# Patient Record
Sex: Male | Born: 1970 | Race: Black or African American | Hispanic: No | Marital: Married | Smoking: Current every day smoker
Health system: Southern US, Community
[De-identification: ages and names within clinical notes are randomized; demographics above are authoritative.]

## PROBLEM LIST (undated history)

## (undated) DIAGNOSIS — F1010 Alcohol abuse, uncomplicated: Secondary | ICD-10-CM

---

## 2012-02-19 ENCOUNTER — Encounter (HOSPITAL_COMMUNITY): Payer: Self-pay | Admitting: *Deleted

## 2012-02-19 ENCOUNTER — Emergency Department (HOSPITAL_COMMUNITY)
Admission: EM | Admit: 2012-02-19 | Discharge: 2012-02-19 | Disposition: A | Discharge status: Home / Self Care | Payer: Self-pay | Attending: Emergency Medicine | Admitting: Emergency Medicine

## 2012-02-19 DIAGNOSIS — K029 Dental caries, unspecified: Secondary | ICD-10-CM | POA: Insufficient documentation

## 2012-02-19 DIAGNOSIS — K0889 Other specified disorders of teeth and supporting structures: Secondary | ICD-10-CM

## 2012-02-19 DIAGNOSIS — F17200 Nicotine dependence, unspecified, uncomplicated: Secondary | ICD-10-CM | POA: Insufficient documentation

## 2012-02-19 MED ORDER — HYDROCODONE-ACETAMINOPHEN 5-325 MG PO TABS: 1.0000 | ORAL_TABLET | Freq: Four times a day (QID) | ORAL | Status: DC | PRN

## 2012-02-19 MED ORDER — PENICILLIN V POTASSIUM 500 MG PO TABS: 500.0000 mg | ORAL_TABLET | Freq: Four times a day (QID) | ORAL | Status: DC

## 2012-02-19 NOTE — ED Provider Notes (Signed)
Medical screening examination/treatment/procedure(s) were performed by non-physician practitioner and as supervising physician I was immediately available for consultation/collaboration.   Argus Caraher Y. Sherel Fennell, MD 02/19/12 1007 

## 2012-02-19 NOTE — ED Provider Notes (Signed)
History     CSN: [id number]  Arrival date & time 02/19/12  0804   First MD Initiated Contact with Patient 02/19/12 0825      Chief Complaint  Patient presents with  . Dental Pain    (Consider location/radiation/quality/duration/timing/severity/associated sxs/prior treatment) HPI The patient presents to the emergency department with dental pain.  The pain began Wednesday night with no relief from otc Advil and Orajel.  He states the pain is sharp and radiates up his right jaw.  The pain lasts for approximately five minutes and then fades away.  He states the pain is giving him a headache and rates the pain 10/10 at this time.   The pain is aggravated by chewing and talking.  He reports a history of dental abscesses and poor dental hygiene.   He denies fever, facial swelling, dysphagia, sore throat, cough, nausea, and vomiting.    History reviewed. No pertinent past medical history.  History reviewed. No pertinent past surgical history.  No family history on file.  History  Substance Use Topics  . Smoking status: Current Every Day Smoker -- 0.5 packs/day    Types: Cigarettes  . Smokeless tobacco: Not on file  . Alcohol Use: Yes      Review of Systems All pertinent positives and negatives in the history of present illness   Allergies  Review of patient's allergies indicates no known allergies.  Home Medications  No current outpatient prescriptions on file.  BP 145/100  Pulse 99  Temp 98.9 F (37.2 C) (Oral)  Resp 18  SpO2 97%  Physical Exam  Nursing note and vitals reviewed. Constitutional: He appears well-developed and well-nourished.  HENT:  Head: Normocephalic and atraumatic. No trismus in the jaw.  Mouth/Throat: Uvula is midline, oropharynx is clear and moist and mucous membranes are normal. Abnormal dentition. Dental caries present. No dental abscesses or uvula swelling. No oropharyngeal exudate.  Neck: Normal range of motion. Neck supple.  Cardiovascular:  Normal rate, regular rhythm and normal heart sounds.  Exam reveals no friction rub.   No murmur heard. Pulmonary/Chest: Effort normal and breath sounds normal. No respiratory distress.  Skin: Skin is warm and dry. No rash noted. No erythema. No pallor.    ED Course  Procedures (including critical care time) The patient will be referred to dentistry. He is told to return here as needed.    MDM          Carlyle Dolly, PA-C 02/19/12 [phone]

## 2012-02-19 NOTE — ED Notes (Signed)
Pt has had toothache in right lower molar since Wednesday.  Pain has been increasing since then

## 2012-02-23 ENCOUNTER — Emergency Department (HOSPITAL_COMMUNITY)
Admission: EM | Admit: 2012-02-23 | Discharge: 2012-02-23 | Disposition: A | Discharge status: Home / Self Care | Payer: Self-pay | Attending: Emergency Medicine | Admitting: Emergency Medicine

## 2012-02-23 DIAGNOSIS — K029 Dental caries, unspecified: Secondary | ICD-10-CM | POA: Insufficient documentation

## 2012-02-23 DIAGNOSIS — K0889 Other specified disorders of teeth and supporting structures: Secondary | ICD-10-CM

## 2012-02-23 DIAGNOSIS — F17200 Nicotine dependence, unspecified, uncomplicated: Secondary | ICD-10-CM | POA: Insufficient documentation

## 2012-02-23 MED ORDER — HYDROCODONE-ACETAMINOPHEN 5-500 MG PO TABS: 1.0000 | ORAL_TABLET | Freq: Four times a day (QID) | ORAL | Status: DC | PRN

## 2012-02-23 NOTE — ED Provider Notes (Signed)
History     CSN: [id number]  Arrival date & time 02/23/12  4098   First MD Initiated Contact with Patient 02/23/12 [phone]      Chief Complaint  Patient presents with  . Dental Pain    (Consider location/radiation/quality/duration/timing/severity/associated sxs/prior treatment) Patient is a Scott Hunt presenting with tooth pain. The history is provided by the patient.  Dental PainPrimary symptoms do not include headaches, fever, shortness of breath or sore throat.  Additional symptoms do not include: trouble swallowing.  pt states right lower dental pain x 1 week. Constant. Dull, non radiating. Was in ed 4 days ago w same, states talked to dentist, has plan to return there in the next 1-2 days, but was told from their office that he needed to come back to ed to get new referral. Denies any acute or abrupt worsening in pain. No facial swelling. No trouble breathing or swallowing. No fever/chills.   No past medical history on file.  No past surgical history on file.  No family history on file.  History  Substance Use Topics  . Smoking status: Current Every Day Smoker -- 0.5 packs/day    Types: Cigarettes  . Smokeless tobacco: Not on file  . Alcohol Use: Yes      Review of Systems  Constitutional: Negative for fever and chills.  HENT: Negative for sore throat and trouble swallowing.   Respiratory: Negative for shortness of breath.   Gastrointestinal: Negative for vomiting.  Skin: Negative for rash.  Neurological: Negative for headaches.    Allergies  Review of patient's allergies indicates no known allergies.  Home Medications   Current Outpatient Rx  Name Route Sig Dispense Refill  . HYDROCODONE-ACETAMINOPHEN 5-325 MG PO TABS Oral Take 1 tablet by mouth every 6 (six) hours as needed.    Marland Kitchen PENICILLIN V POTASSIUM 500 MG PO TABS Oral Take 500 mg by mouth 4 (four) times daily. 3 day supply started Friday 02-19-12 for infection only has 1 day worth left.      BP  139/89  Pulse 83  Temp 98.3 F (36.8 C) (Oral)  Resp 18  SpO2 98%  Physical Exam  Nursing note and vitals reviewed. Constitutional: He is oriented to person, place, and time. He appears well-developed and well-nourished. No distress.  HENT:  Head: Atraumatic.  Nose: Nose normal.  Mouth/Throat: Oropharynx is clear and moist. No oropharyngeal exudate.       Dental decay. Right lower molar w associated gum swelling and tenderness. No fluctuance. No pain/swelling/tenderness to floor of mouth or neck. No facial swelling. Pharynx normal. No trismus.   Eyes: Pupils are equal, round, and reactive to light.  Neck: Neck supple. No tracheal deviation present.  Cardiovascular: Normal rate.   Pulmonary/Chest: Effort normal. No accessory muscle usage. No respiratory distress.  Abdominal: He exhibits no distension.  Musculoskeletal: Normal range of motion.  Lymphadenopathy:    He has no cervical adenopathy.  Neurological: He is alert and oriented to person, place, and time.  Skin: Skin is warm and dry.  Psychiatric: He has a normal mood and affect.    ED Course  Procedures (including critical care time)     MDM  Pt states plans to go back to see Dr Russella Dar in next 1-2 days.         Suzi Roots, MD 02/23/12 [phone]

## 2012-02-23 NOTE — ED Notes (Signed)
Pt was seen in ED fri for toothpain.  Pt has seen dentist and is scheduled to have 4 teeth pulled today at 4pm.  Pt states he needs a referral letter for dentist before teeth can be pulled.  Pt staes the pain meds he was given haved helped with pain but the teeth need to be pulled.  Pt alert oriented X4

## 2012-10-29 ENCOUNTER — Emergency Department (HOSPITAL_COMMUNITY)
Admission: EM | Admit: 2012-10-29 | Discharge: 2012-10-29 | Disposition: A | Discharge status: Home / Self Care | Payer: Self-pay | Attending: Emergency Medicine | Admitting: Emergency Medicine

## 2012-10-29 ENCOUNTER — Encounter (HOSPITAL_COMMUNITY): Payer: Self-pay | Admitting: *Deleted

## 2012-10-29 DIAGNOSIS — Z8719 Personal history of other diseases of the digestive system: Secondary | ICD-10-CM | POA: Insufficient documentation

## 2012-10-29 DIAGNOSIS — R1013 Epigastric pain: Secondary | ICD-10-CM | POA: Insufficient documentation

## 2012-10-29 DIAGNOSIS — Z8711 Personal history of peptic ulcer disease: Secondary | ICD-10-CM | POA: Insufficient documentation

## 2012-10-29 DIAGNOSIS — F1020 Alcohol dependence, uncomplicated: Secondary | ICD-10-CM

## 2012-10-29 DIAGNOSIS — F17200 Nicotine dependence, unspecified, uncomplicated: Secondary | ICD-10-CM | POA: Insufficient documentation

## 2012-10-29 DIAGNOSIS — F10229 Alcohol dependence with intoxication, unspecified: Secondary | ICD-10-CM | POA: Insufficient documentation

## 2012-10-29 DIAGNOSIS — K2920 Alcoholic gastritis without bleeding: Secondary | ICD-10-CM | POA: Insufficient documentation

## 2012-10-29 DIAGNOSIS — R112 Nausea with vomiting, unspecified: Secondary | ICD-10-CM | POA: Insufficient documentation

## 2012-10-29 HISTORY — DX: Alcohol abuse, uncomplicated: F10.10

## 2012-10-29 LAB — LIPASE, BLOOD: Lipase: 56 U/L (ref 11–59)

## 2012-10-29 LAB — CBC WITH DIFFERENTIAL/PLATELET
Basophils Absolute: 0.1 10*3/uL (ref 0.0–0.1)
Basophils Relative: 1 % (ref 0–1)
Eosinophils Absolute: 0.1 10*3/uL (ref 0.0–0.7)
Eosinophils Relative: 1 % (ref 0–5)
HCT: 44.2 % (ref 39.0–52.0)
MCH: 31.3 pg (ref 26.0–34.0)
MCHC: 34.8 g/dL (ref 30.0–36.0)
MCV: 89.8 fL (ref 78.0–100.0)
Monocytes Absolute: 0.4 10*3/uL (ref 0.1–1.0)
Neutro Abs: 4.6 10*3/uL (ref 1.7–7.7)
RDW: 12.8 % (ref 11.5–15.5)

## 2012-10-29 LAB — COMPREHENSIVE METABOLIC PANEL
AST: 26 U/L (ref 0–37)
Albumin: 4.7 g/dL (ref 3.5–5.2)
Calcium: 9.4 mg/dL (ref 8.4–10.5)
Creatinine, Ser: 1.12 mg/dL (ref 0.50–1.35)

## 2012-10-29 LAB — ETHANOL: Alcohol, Ethyl (B): 203 mg/dL — ABNORMAL HIGH (ref 0–11)

## 2012-10-29 MED ORDER — ONDANSETRON HCL 4 MG/2ML IJ SOLN
4.0000 mg | Freq: Once | INTRAMUSCULAR | Status: AC
  Administered 2012-10-29: 4 mg via INTRAVENOUS
  Filled 2012-10-29: qty 2

## 2012-10-29 MED ORDER — FAMOTIDINE IN NACL 20-0.9 MG/50ML-% IV SOLN
20.0000 mg | Freq: Once | INTRAVENOUS | Status: AC
  Administered 2012-10-29: 20 mg via INTRAVENOUS
  Filled 2012-10-29: qty 50

## 2012-10-29 MED ORDER — SODIUM CHLORIDE 0.9 % IV BOLUS (SEPSIS)
1000.0000 mL | Freq: Once | INTRAVENOUS | Status: AC
  Administered 2012-10-29: 1000 mL via INTRAVENOUS

## 2012-10-29 MED ORDER — THIAMINE HCL 100 MG/ML IJ SOLN
100.0000 mg | Freq: Once | INTRAMUSCULAR | Status: AC
  Administered 2012-10-29: 100 mg via INTRAVENOUS
  Filled 2012-10-29: qty 2

## 2012-10-29 MED ORDER — ONDANSETRON HCL 4 MG PO TABS: 4.0000 mg | ORAL_TABLET | Freq: Four times a day (QID) | ORAL | Status: DC

## 2012-10-29 MED ORDER — PANTOPRAZOLE SODIUM 20 MG PO TBEC: 40.0000 mg | DELAYED_RELEASE_TABLET | Freq: Every day | ORAL | Status: DC

## 2012-10-29 MED ORDER — SODIUM CHLORIDE 0.9 % IV BOLUS (SEPSIS): 1000.0000 mL | Freq: Once | INTRAVENOUS | Status: DC

## 2012-10-29 NOTE — ED Notes (Signed)
The pt arrived by gems  From home the pt has been vomiting and having chest pain tonight after drinking alcohol all day

## 2012-10-29 NOTE — ED Notes (Signed)
The pts pain did not start until he started drinking.  He has a history of pancreatitis according to family sitting with the pt.

## 2012-10-29 NOTE — ED Notes (Signed)
Pt sleeping. 

## 2012-10-29 NOTE — ED Notes (Signed)
1000 ml nss infused.  1000cc nss  Denny Levy

## 2012-10-29 NOTE — ED Notes (Signed)
Patient discharged with his wife instructions given using the teach back method. Patient verbalizes an understanding.

## 2012-10-29 NOTE — ED Provider Notes (Signed)
42yo M, received at change of shift. +heavy etoh intake last night. Sleeping most of ED visit. Now A&O, resps easy, tol PO well, ambulated with steady gait. Appears clinically sober and wants to go home now. Will d/c.   Laray Anger, DO 10/29/12 1059

## 2012-10-29 NOTE — ED Provider Notes (Signed)
History     CSN: [id number]  Arrival date & time 10/29/12  0400   First MD Initiated Contact with Patient 10/29/12 [phone]      Chief Complaint  Patient presents with  . Emesis    (Consider location/radiation/quality/duration/timing/severity/associated sxs/prior treatment) HPI  Scott Hunt is a 42 year old man who is brought to the emergency department from home. He is an alcoholic and drinks about one case of beer per day. His wife says that he drank his usual amount of beer today and then began taking shots of brandy, Hunt and vodka with family members at a family celebration.  Around 1 AM, the patient developed intractable nausea and vomiting along with epigastric pain. Emesis has been nonbloody and nonbilious. Patient has not had diarrhea. NO fever. No history of abdominal surgeries.   Wife says that the patient has a history of GERD, PUD and alcoholic pancreatitis.  He is not compliant with any medications. '  The only information that I am able to obtain from the patient is that he is experiencing abdominal pain.   Past Medical History  Diagnosis Date  . Alcohol abuse     No past surgical history on file.  No family history on file.  History  Substance Use Topics  . Smoking status: Current Every Day Smoker -- 0.50 packs/day    Types: Cigarettes  . Smokeless tobacco: Not on file  . Alcohol Use: Yes      Review of Systems Unable to obtain from the patient who is somnolent, responds to loud stimuli  Allergies  Review of patient's allergies indicates no known allergies.  Home Medications  No current outpatient prescriptions on file.  BP 113/68  Pulse 98  Temp(Src) 98.2 F (36.8 C) (Oral)  Resp 16  SpO2 96%  Physical Exam Gen: well developed and well nourished appearing Head: NCAT Eyes: PERL, EOMI Nose: no epistaixis or rhinorrhea Mouth/throat: mucosa is mildly dehydrated and pink Neck: supple, no stridor Lungs: CTA B, no wheezing, rhonchi or rales CV:  rapid and regular, fixed split S2, no murmur Abd: soft, tender epigastrium, nondistended Back: no ttp, no cva ttp Skin: no rashese, wnl Neuro: CN ii-xii grossly intact, no focal deficits Psyche; normal affect,  calm and cooperative.   ED Course  Procedures (including critical care time)  Results for orders placed during the hospital encounter of 10/29/12 (from the past 24 hour(s))  CBC WITH DIFFERENTIAL     Status: None   Collection Time    10/29/12  4:22 AM      Result Value Range   WBC 7.3  4.0 - 10.5 K/uL   RBC 4.92  4.22 - 5.81 MIL/uL   Hemoglobin 15.4  13.0 - 17.0 g/dL   HCT 87.5  64.3 - 32.9 %   MCV 89.8  78.0 - 100.0 fL   MCH 31.3  26.0 - 34.0 pg   MCHC 34.8  30.0 - 36.0 g/dL   RDW 51.8  84.1 - 66.0 %   Platelets 268  150 - 400 K/uL   Neutrophils Relative % 64  43 - 77 %   Neutro Abs 4.6  1.7 - 7.7 K/uL   Lymphocytes Relative 29  12 - 46 %   Lymphs Abs 2.1  0.7 - 4.0 K/uL   Monocytes Relative 6  3 - 12 %   Monocytes Absolute 0.4  0.1 - 1.0 K/uL   Eosinophils Relative 1  0 - 5 %   Eosinophils Absolute 0.1  0.0 -  0.7 K/uL   Basophils Relative 1  0 - 1 %   Basophils Absolute 0.1  0.0 - 0.1 K/uL  COMPREHENSIVE METABOLIC PANEL     Status: Abnormal   Collection Time    10/29/12  4:22 AM      Result Value Range   Sodium 137  135 - 145 mEq/L   Potassium 4.1  3.5 - 5.1 mEq/L   Chloride 101  96 - 112 mEq/L   CO2 23  19 - 32 mEq/L   Glucose, Bld 114 (*) 70 - 99 mg/dL   BUN 18  6 - 23 mg/dL   Creatinine, Ser 8.65  0.50 - 1.35 mg/dL   Calcium 9.4  8.4 - 78.4 mg/dL   Total Protein 8.7 (*) 6.0 - 8.3 g/dL   Albumin 4.7  3.5 - 5.2 g/dL   AST 26  0 - 37 U/L   ALT 28  0 - 53 U/L   Alkaline Phosphatase 133 (*) 39 - 117 U/L   Total Bilirubin 0.2 (*) 0.3 - 1.2 mg/dL   GFR calc non Af Amer 80 (*) >90 mL/min   GFR calc Af Amer >90  >90 mL/min  LIPASE, BLOOD     Status: None   Collection Time    10/29/12  4:22 AM      Result Value Range   Lipase 56  11 - 59 U/L  ETHANOL      Status: Abnormal   Collection Time    10/29/12  4:22 AM      Result Value Range   Alcohol, Ethyl (B) 203 (*) 0 - 11 mg/dL      MDM  ddx - alcoholic pancreatitis, pud, alcoholic gastritis.   Patient with alcohol intoxication and alcoholic gastritis. We are treating with IVF, thiamine, pepcid, antiemetic. Patient will need to be re-evaluated once clinically sober for the ability to tolerate po intake. If he is able to tolerate po intake, he should be stable for discharge with plan for PPI and close outpatient follow up.          Brandt Loosen, MD 10/29/12 [phone]

## 2012-10-29 NOTE — ED Notes (Signed)
Patient sitting up sipping on ginger ale.

## 2013-02-08 ENCOUNTER — Emergency Department (HOSPITAL_COMMUNITY)
Admission: EM | Admit: 2013-02-08 | Discharge: 2013-02-08 | Disposition: A | Discharge status: Home / Self Care | Payer: Self-pay | Attending: Emergency Medicine | Admitting: Emergency Medicine

## 2013-02-08 DIAGNOSIS — Y9389 Activity, other specified: Secondary | ICD-10-CM | POA: Insufficient documentation

## 2013-02-08 DIAGNOSIS — Y929 Unspecified place or not applicable: Secondary | ICD-10-CM | POA: Insufficient documentation

## 2013-02-08 DIAGNOSIS — IMO0002 Reserved for concepts with insufficient information to code with codable children: Secondary | ICD-10-CM | POA: Insufficient documentation

## 2013-02-08 DIAGNOSIS — F17200 Nicotine dependence, unspecified, uncomplicated: Secondary | ICD-10-CM | POA: Insufficient documentation

## 2013-02-08 DIAGNOSIS — X500XXA Overexertion from strenuous movement or load, initial encounter: Secondary | ICD-10-CM | POA: Insufficient documentation

## 2013-02-08 DIAGNOSIS — M62830 Muscle spasm of back: Secondary | ICD-10-CM

## 2013-02-08 DIAGNOSIS — Z79899 Other long term (current) drug therapy: Secondary | ICD-10-CM | POA: Insufficient documentation

## 2013-02-08 MED ORDER — IBUPROFEN 800 MG PO TABS: 800.0000 mg | ORAL_TABLET | Freq: Three times a day (TID) | ORAL | Status: DC

## 2013-02-08 MED ORDER — CYCLOBENZAPRINE HCL 10 MG PO TABS: 10.0000 mg | ORAL_TABLET | Freq: Two times a day (BID) | ORAL | Status: DC | PRN

## 2013-02-08 MED ORDER — CYCLOBENZAPRINE HCL 10 MG PO TABS
10.0000 mg | ORAL_TABLET | Freq: Once | ORAL | Status: AC
  Administered 2013-02-08: 10 mg via ORAL
  Filled 2013-02-08: qty 1

## 2013-02-08 NOTE — ED Notes (Signed)
Was lifting boxes yesterday now back hurts

## 2013-02-08 NOTE — ED Provider Notes (Signed)
CSN: [id number]     Arrival date & time 02/08/13  1339 History   This chart was scribed for non-physician practitioner Francee Piccolo, PA-C  working with Glynn Octave, MD by Valera Castle, ED scribe. This patient was seen in room TR11C/TR11C and the patient's care was started at 3:07 PM.    Chief Complaint  Patient presents with  . Back Pain    The history is provided by the patient. No language interpreter was used.   HPI Comments: Scott Hunt is a 42 y.o. male with a h/o EtOH abuse who presents to the Emergency Department complaining of sudden, sharp, throbbing, constant, left, lower, back pain, with a severity of 9/10 onset yesterday when he was lifting boxes. He reports that the pain radiates down this left side. He states that bending over and general movement exacerbates the pain.  He reports taking Motrin, and applying ice packs, with some relief initially, but that the pain returned. He denies fever, bladder or bowel incontinence, hx of cancer, hx of IVDA, and any other associated symptoms. He reports being a current every day smoker, .5 PPD, and occasional EtOH use. He has no known allergies, and he denies any other medical history. He denies h/o back surgeries.   Past Medical History  Diagnosis Date  . Alcohol abuse    No past surgical history on file. No family history on file. History  Substance Use Topics  . Smoking status: Current Every Day Smoker -- 0.50 packs/day    Types: Cigarettes  . Smokeless tobacco: Not on file  . Alcohol Use: Yes    Review of Systems  Constitutional: Negative for fever.  Musculoskeletal: Positive for back pain.  All other systems reviewed and are negative.    Allergies  Review of patient's allergies indicates no known allergies.  Home Medications   Current Outpatient Rx  Name  Route  Sig  Dispense  Refill  . ibuprofen (ADVIL,MOTRIN) 200 MG tablet   Oral   Take 400 mg by mouth every 6 (six) hours as needed for pain.          . cyclobenzaprine (FLEXERIL) 10 MG tablet   Oral   Take 1 tablet (10 mg total) by mouth 2 (two) times daily as needed for muscle spasms.   20 tablet   0   . ibuprofen (ADVIL,MOTRIN) 800 MG tablet   Oral   Take 1 tablet (800 mg total) by mouth 3 (three) times daily.   21 tablet   0    Triage Vitals: BP 127/89  Pulse 91  Temp(Src) 97.9 F (36.6 C)  Resp 16  SpO2 100%  Physical Exam  Nursing note and vitals reviewed. Constitutional: He is oriented to person, place, and time. He appears well-developed and well-nourished. No distress.  HENT:  Head: Normocephalic and atraumatic.  Eyes: Conjunctivae and EOM are normal. Pupils are equal, round, and reactive to light.  Neck: Normal range of motion. Neck supple. No spinous process tenderness present.  Cardiovascular: Normal rate, regular rhythm, normal heart sounds and intact distal pulses.   Pulmonary/Chest: Effort normal and breath sounds normal. No respiratory distress. He has no wheezes. He has no rales.  Abdominal: Soft. There is no tenderness.  Musculoskeletal: Normal range of motion.       Cervical back: Normal.       Thoracic back: Normal.       Lumbar back: He exhibits tenderness and spasm. He exhibits normal range of motion, no bony tenderness, no swelling, no  edema, no deformity and no laceration.  Neurological: He is alert and oriented to person, place, and time. He has normal strength. No cranial nerve deficit or sensory deficit. Gait normal. GCS eye subscore is 4. GCS verbal subscore is 5. GCS motor subscore is 6.  No pronator drift. Bilateral heel-knee-shin intact.  Skin: Skin is warm and dry. He is not diaphoretic.  Psychiatric: He has a normal mood and affect. His behavior is normal.    ED Course  Procedures (including critical care time)  DIAGNOSTIC STUDIES: Oxygen Saturation is 100% on room air, normal by my interpretation.    COORDINATION OF CARE: 3:13 PM-Discussed treatment plan which includes Flexeril  with pt at bedside and pt agreed to plan. Discussed clinical suspicion of muscle spasms. Will discharge pt with Ibuprofen and Flexeril.     Labs Review Labs Reviewed - No data to display Imaging Review No results found.  MDM   1. Muscle spasm of back     Afebrile, NAD, non-toxic appearing, AAOx4. Patient with back pain.  No neurological deficits and normal neuro exam.  Patient can walk but states is painful.  No loss of bowel or bladder control.  No concern for cauda equina.  No fever, night sweats, weight loss, h/o cancer, IVDU.  RICE protocol and pain medicine indicated and discussed with patient. Return precautions discussed. Patient is agreeable to plan. Patient is stable at time of discharge      I personally performed the services described in this documentation, which was scribed in my presence. The recorded information has been reviewed and is accurate.     Jeannetta Ellis, PA-C 02/08/13 2336

## 2013-02-09 NOTE — ED Provider Notes (Signed)
Medical screening examination/treatment/procedure(s) were performed by non-physician practitioner and as supervising physician I was immediately available for consultation/collaboration.   Glynn Octave, MD 02/09/13 [phone]

## 2013-07-17 ENCOUNTER — Emergency Department (HOSPITAL_COMMUNITY): Payer: Self-pay

## 2013-07-17 ENCOUNTER — Encounter (HOSPITAL_COMMUNITY): Payer: Self-pay | Admitting: Emergency Medicine

## 2013-07-17 ENCOUNTER — Emergency Department (HOSPITAL_COMMUNITY)
Admission: EM | Admit: 2013-07-17 | Discharge: 2013-07-17 | Disposition: A | Discharge status: Home / Self Care | Payer: Self-pay | Attending: Emergency Medicine | Admitting: Emergency Medicine

## 2013-07-17 DIAGNOSIS — Z79899 Other long term (current) drug therapy: Secondary | ICD-10-CM | POA: Insufficient documentation

## 2013-07-17 DIAGNOSIS — R1013 Epigastric pain: Secondary | ICD-10-CM | POA: Insufficient documentation

## 2013-07-17 DIAGNOSIS — R112 Nausea with vomiting, unspecified: Secondary | ICD-10-CM | POA: Insufficient documentation

## 2013-07-17 DIAGNOSIS — F1020 Alcohol dependence, uncomplicated: Secondary | ICD-10-CM | POA: Insufficient documentation

## 2013-07-17 DIAGNOSIS — F17200 Nicotine dependence, unspecified, uncomplicated: Secondary | ICD-10-CM | POA: Insufficient documentation

## 2013-07-17 DIAGNOSIS — K5900 Constipation, unspecified: Secondary | ICD-10-CM | POA: Insufficient documentation

## 2013-07-17 DIAGNOSIS — R748 Abnormal levels of other serum enzymes: Secondary | ICD-10-CM | POA: Insufficient documentation

## 2013-07-17 DIAGNOSIS — E860 Dehydration: Secondary | ICD-10-CM | POA: Insufficient documentation

## 2013-07-17 LAB — URINALYSIS, ROUTINE W REFLEX MICROSCOPIC
Glucose, UA: NEGATIVE mg/dL
Hgb urine dipstick: NEGATIVE
Ketones, ur: 15 mg/dL — AB
Leukocytes, UA: NEGATIVE
Nitrite: NEGATIVE
Protein, ur: 30 mg/dL — AB
Specific Gravity, Urine: 1.033 — ABNORMAL HIGH (ref 1.005–1.030)
Urobilinogen, UA: 1 mg/dL (ref 0.0–1.0)
pH: 8 (ref 5.0–8.0)

## 2013-07-17 LAB — CBC WITH DIFFERENTIAL/PLATELET
Basophils Absolute: 0.1 10*3/uL (ref 0.0–0.1)
Basophils Relative: 1 % (ref 0–1)
Eosinophils Absolute: 0.1 K/uL (ref 0.0–0.7)
Eosinophils Relative: 1 % (ref 0–5)
HCT: 42.4 % (ref 39.0–52.0)
Hemoglobin: 14.8 g/dL (ref 13.0–17.0)
Lymphocytes Relative: 36 % (ref 12–46)
Lymphs Abs: 2.2 K/uL (ref 0.7–4.0)
MCH: 30.8 pg (ref 26.0–34.0)
MCHC: 34.9 g/dL (ref 30.0–36.0)
MCV: 88.3 fL (ref 78.0–100.0)
Monocytes Absolute: 0.5 10*3/uL (ref 0.1–1.0)
Monocytes Relative: 9 % (ref 3–12)
Neutro Abs: 3.1 10*3/uL (ref 1.7–7.7)
Neutrophils Relative %: 52 % (ref 43–77)
Platelets: 296 K/uL (ref 150–400)
RBC: 4.8 MIL/uL (ref 4.22–5.81)
RDW: 12.6 % (ref 11.5–15.5)
WBC: 6 10*3/uL (ref 4.0–10.5)

## 2013-07-17 LAB — COMPREHENSIVE METABOLIC PANEL WITH GFR
ALT: 19 U/L (ref 0–53)
Albumin: 4.3 g/dL (ref 3.5–5.2)
BUN: 20 mg/dL (ref 6–23)
Calcium: 9.6 mg/dL (ref 8.4–10.5)
GFR calc Af Amer: 78 mL/min — ABNORMAL LOW (ref >90)
Glucose, Bld: 120 mg/dL — ABNORMAL HIGH (ref 70–99)
Sodium: 139 meq/L (ref 137–147)
Total Protein: 8.1 g/dL (ref 6.0–8.3)

## 2013-07-17 LAB — LIPASE, BLOOD: Lipase: 88 U/L — ABNORMAL HIGH (ref 11–59)

## 2013-07-17 LAB — URINE MICROSCOPIC-ADD ON

## 2013-07-17 LAB — COMPREHENSIVE METABOLIC PANEL
AST: 19 U/L (ref 0–37)
Alkaline Phosphatase: 133 U/L — ABNORMAL HIGH (ref 39–117)
CO2: 31 mEq/L (ref 19–32)
Chloride: 95 mEq/L — ABNORMAL LOW (ref 96–112)
Creatinine, Ser: 1.28 mg/dL (ref 0.50–1.35)
GFR calc non Af Amer: 68 mL/min — ABNORMAL LOW (ref >90)
Potassium: 3.6 mEq/L — ABNORMAL LOW (ref 3.7–5.3)
Total Bilirubin: 0.7 mg/dL (ref 0.3–1.2)

## 2013-07-17 MED ORDER — ONDANSETRON HCL 4 MG/2ML IJ SOLN
4.0000 mg | Freq: Once | INTRAMUSCULAR | Status: AC
  Administered 2013-07-17: 4 mg via INTRAVENOUS
  Filled 2013-07-17: qty 2

## 2013-07-17 MED ORDER — PROMETHAZINE HCL 25 MG PO TABS: 25.0000 mg | ORAL_TABLET | Freq: Four times a day (QID) | ORAL | Status: DC | PRN

## 2013-07-17 MED ORDER — SODIUM CHLORIDE 0.9 % IV BOLUS (SEPSIS)
2000.0000 mL | Freq: Once | INTRAVENOUS | Status: AC
  Administered 2013-07-17: 2000 mL via INTRAVENOUS

## 2013-07-17 MED ORDER — POLYETHYLENE GLYCOL 3350 17 G PO PACK: 17.0000 g | PACK | Freq: Every day | ORAL | Status: DC

## 2013-07-17 MED ORDER — PROMETHAZINE HCL 25 MG/ML IJ SOLN
25.0000 mg | Freq: Once | INTRAMUSCULAR | Status: AC
  Administered 2013-07-17: 25 mg via INTRAVENOUS
  Filled 2013-07-17: qty 1

## 2013-07-17 MED ORDER — LORAZEPAM 2 MG/ML IJ SOLN
1.0000 mg | Freq: Once | INTRAMUSCULAR | Status: AC
  Administered 2013-07-17: 1 mg via INTRAVENOUS
  Filled 2013-07-17: qty 1

## 2013-07-17 MED ORDER — ONDANSETRON 4 MG PO TBDP
8.0000 mg | ORAL_TABLET | Freq: Once | ORAL | Status: AC
  Administered 2013-07-17: 8 mg via ORAL
  Filled 2013-07-17: qty 2

## 2013-07-17 MED ORDER — MORPHINE SULFATE 4 MG/ML IJ SOLN
4.0000 mg | Freq: Once | INTRAMUSCULAR | Status: AC
  Administered 2013-07-17: 4 mg via INTRAVENOUS
  Filled 2013-07-17: qty 1

## 2013-07-17 NOTE — Discharge Instructions (Signed)
1. Medications: phenergan for nausea, miralax for constipation, usual home medications 2. Treatment: rest, drink plenty of fluids, advance diet slowly 3. Follow Up: Please followup with your primary doctor for discussion of your diagnoses and further evaluation after today's visit; if you do not have a primary care doctor use the resource guide provided to find one;   Acute Pancreatitis Acute pancreatitis is a disease in which the pancreas becomes suddenly inflamed. The pancreas is a large gland located behind your stomach. The pancreas produces enzymes that help digest food. The pancreas also releases the hormones glucagon and insulin that help regulate blood sugar. Damage to the pancreas occurs when the digestive enzymes from the pancreas are activated and begin attacking the pancreas before being released into the intestine. Most acute attacks last a couple of days and can cause serious complications. Some people become dehydrated and develop low blood pressure. In severe cases, bleeding into the pancreas can lead to shock and can be life-threatening. The lungs, heart, and kidneys may fail. CAUSES  Pancreatitis can happen to anyone. In some cases, the cause is unknown. Most cases are caused by:  Alcohol abuse.  Gallstones. Other less common causes are:  Certain medicines.  Exposure to certain chemicals.  Infection.  Damage caused by an accident (trauma).  Abdominal surgery. SYMPTOMS   Pain in the upper abdomen that may radiate to the back.  Tenderness and swelling of the abdomen.  Nausea and vomiting. DIAGNOSIS  Your caregiver will perform a physical exam. Blood and stool tests may be done to confirm the diagnosis. Imaging tests may also be done, such as X-rays, CT scans, or an ultrasound of the abdomen. TREATMENT  Treatment usually requires a stay in the hospital. Treatment may include:  Pain medicine.  Fluid replacement through an intravenous line (IV).  Placing a tube in  the stomach to remove stomach contents and control vomiting.  Not eating for 3 or 4 days. This gives your pancreas a rest, because enzymes are not being produced that can cause further damage.  Antibiotic medicines if your condition is caused by an infection.  Surgery of the pancreas or gallbladder. HOME CARE INSTRUCTIONS   Follow the diet advised by your caregiver. This may involve avoiding alcohol and decreasing the amount of fat in your diet.  Eat smaller, more frequent meals. This reduces the amount of digestive juices the pancreas produces.  Drink enough fluids to keep your urine clear or pale yellow.  Only take over-the-counter or prescription medicines as directed by your caregiver.  Avoid drinking alcohol if it caused your condition.  Do not smoke.  Get plenty of rest.  Check your blood sugar at home as directed by your caregiver.  Keep all follow-up appointments as directed by your caregiver. SEEK MEDICAL CARE IF:   You do not recover as quickly as expected.  You develop new or worsening symptoms.  You have persistent pain, weakness, or nausea.  You recover and then have another episode of pain. SEEK IMMEDIATE MEDICAL CARE IF:   You are unable to eat or keep fluids down.  Your pain becomes severe.  You have a fever or persistent symptoms for more than 2 to 3 days.  You have a fever and your symptoms suddenly get worse.  Your skin or the white part of your eyes turn yellow (jaundice).  You develop vomiting.  You feel dizzy, or you faint.  Your blood sugar is high (over 300 mg/dL). MAKE SURE YOU:   Understand these  instructions.  Will watch your condition.  Will get help right away if you are not doing well or get worse. Document Released: 04/27/2005 Document Revised: 10/27/2011 Document Reviewed: 08/06/2011 [facility] Patient Information 2014 [place], [place].   Constipation, Adult Constipation is when a person:  Poops (bowel movement) less than  3 times a week.  Has a hard time pooping.  Has poop that is dry, hard, or bigger than normal. HOME CARE   Eat more fiber, such as fruits, vegetables, whole grains like brown rice, and beans.  Eat less fatty foods and sugar. This includes [place] fries, hamburgers, cookies, candy, and soda.  If you are not getting enough fiber from food, take products with added fiber in them (supplements).  Drink enough fluid to keep your pee (urine) clear or pale yellow.  Go to the restroom when you feel like you need to poop. Do not hold it.  Only take medicine as told by your doctor. Do not take medicines that help you poop (laxatives) without talking to your doctor first.  Exercise on a regular basis, or as told by your doctor. GET HELP RIGHT AWAY IF:   You have bright red blood in your poop (stool).  Your constipation lasts more than 4 days or gets worse.  You have belly (abdomen) or butt (rectal) pain.  You have thin poop (as thin as a pencil).  You lose weight, and it cannot be explained. MAKE SURE YOU:   Understand these instructions.  Will watch your condition.  Will get help right away if you are not doing well or get worse. Document Released: 10/14/2007 Document Revised: 07/20/2011 Document Reviewed: 02/06/2013 [facility] Patient Information 2014 [place], [place].   Emergency Department Resource Guide 1) Find a Doctor and Pay Out of Pocket Although you won't have to find out who is covered by your insurance plan, it is a good idea to ask around and get recommendations. You will then need to call the office and see if the doctor you have chosen will accept you as a new patient and what types of options they offer for patients who are self-pay. Some doctors offer discounts or will set up payment plans for their patients who do not have insurance, but you will need to ask so you aren't surprised when you get to your appointment.  2) Contact Your Local Health Department Not all  health departments have doctors that can see patients for sick visits, but many do, so it is worth a call to see if yours does. If you don't know where your local health department is, you can check in your phone book. The CDC also has a tool to help you locate your state's health department, and many state websites also have listings of all of their local health departments.  3) Find a Walk-in Clinic If your illness is not likely to be very severe or complicated, you may want to try a walk in clinic. These are popping up all over the country in pharmacies, drugstores, and shopping centers. They're usually staffed by nurse practitioners or physician assistants that have been trained to treat common illnesses and complaints. They're usually fairly quick and inexpensive. However, if you have serious medical issues or chronic medical problems, these are probably not your best option.  No Primary Care Doctor: - Call Health Connect at  [phone] - they can help you locate a primary care doctor that  accepts your insurance, provides certain services, etc. - Physician Referral Service- [phone]  Chronic  Pain Problems: Organization         Address  Phone   Notes  [facility]  [phone] Patients need to be referred by their primary care doctor.   Medication Assistance: Organization         Address  Phone   Notes  [facility] Medication [facility] [address] [place] [place], [place] [address] [phone] --Must be a resident of [facility] -- Must have NO insurance coverage whatsoever (no Medicaid/ Medicare, etc.) -- The pt. MUST have a primary care doctor that directs their care regularly and follows them in the community   [facility]ist  [phone]   [employer]  [phone]    Agencies that provide inexpensive medical care: Organization         Address  Phone   Notes  [facility]  [phone]   [facility]     [phone]   [facility] [address] [place], [place] [address] [phone]   [facility] [address] [place] [phone]   Planned Parenthood    [phone]   [facility]    [phone]   [facility]  [address], [place] Phone:  [phone], Fax:  [phone] Hours of Operation:  9 am - 6 pm, M-F.  Also accepts Medicaid/Medicare and self-pay.  [facility]  [address], [place] Phone: [phone], Fax: [phone]. Hours of Operation:  8:30 am - 5:30 pm, M-F.  Also accepts Medicaid and self-pay.  [facility] [place] [address], [place] Phone: [phone]   [facility] [address] Natasha Bence [place], [place] [phone], Ext. [phone] Mondays & Thursdays: 7-9 AM.  First 15 patients are seen on a first come, first serve basis.    Medicaid-accepting [facility] Providers:  Organization         Address  Phone   Notes  [facility] [address], [place] [phone] Also accepts self-pay patients.  [facility] [address] Laurell [place], [place]  [phone]   [facility] [address], [place] [phone]   [facility] [address], [place] [phone]   Renaye Rakers [address], [place]   [phone] Only accepts [place] Access [place] patients after they have their name applied to their card.   Self-Pay (no insurance) in [facility]:  Organization         Address  Phone   Notes  Sickle Cell Patients, [facility] Internal Medicine [address] [place], [place] [phone]   [facility] [address] [place], [place] [phone]   [facility] [place]  [address], [place] [phone]     [facility]/Dr. Osei-Bonsu  [address], [place] or [address], [place] [phone] Phone number for both [place] and [place] locations is the same.  Urgent Medical and [facility] [address], [place] [phone]   [facility] [address], [place] or [address] [phone]   [facility] [address] [place] [phone], phone; [phone], fax Sees  patients 1st and 3rd Saturday of every month.  Must not qualify for public or private insurance (i.e. Medicaid, Medicare, [place] Health Choice, Veterans' Benefits)  Household income should be no more than 200% of the poverty level The clinic cannot treat you if you are pregnant or think you are pregnant  Sexually transmitted diseases are not treated at the clinic.    Dental Care: Organization         Address  Phone  Notes  [facility] [address], [place] [phone] Accepts children up to age 43 who are enrolled in [place] or [place] Health Choice; pregnant women with a Medicaid card; and children who have applied for Medicaid or [place] Health Choice, but were declined, whose parents can pay a reduced fee at time of service.  [facility] [place]  [address], [employer] [facility] [place] [phone] Accepts children up to age 43 who are enrolled in [place] or [place] Health Choice; pregnant women with a Medicaid card; and children who have applied for Medicaid or [place] Health Choice, but were declined, whose parents can pay a reduced fee at time of service.  [facility]  [address], [place] [phone] Patients are seen by appointment only. Walk-ins are not accepted. [facility] will see patients 43 years of age and older. Monday - Tuesday (8am-5pm) Most Wednesdays (8:30-5pm) $30 per visit,  cash only  [facility]  [address], [facility] [place] [phone] Patients are seen by appointment only. Walk-ins are not accepted. [facility] will see patients 43 years of age and older. One Wednesday Evening (Monthly: Volunteer Based).  $30 per visit, cash only  [employer] [facility]  [phone] for adults; Children under age 754, call Graduate Pediatric Dentistry at [phone]. Children aged 944-14, please call [phone] to request a pediatric application.  Dental services are provided in all areas of dental care including fillings, crowns and bridges, complete and partial dentures, implants, gum treatment, root canals, and extractions. Preventive care is also provided. Treatment is provided to both adults and children. Patients are selected via a lottery and there is often a waiting list.   [facility] [address], [place]  [phone] [email or web address]   [facility] [address], [place], [place] [phone], Ext. [phone] Second and Fourth Thursday of each month, opens at 6:30 AM; Clinic ends at 9 AM.  Patients are seen on a first-come first-served basis, and a limited number are seen during each clinic.   [facility]  [address], [place], [place] [phone]   Eligibility Requirements You must have lived in [place], [place], or [place] counties for at least the last three months.   You cannot be eligible for state or federal sponsored National Cityhealthcare insurance, including CIGNAVeterans Administration, [place]icaid, or [employer]icare.   You generally cannot be eligible for healthcare insurance through your employer.    How to apply: Eligibility screenings are held every Tuesday and Wednesday afternoon from 1:00 pm until 4:00 pm. You do not need an appointment for the interview!  [facility] [address], [place], [place] [phone]   [facility]   [phone]   [facility]  [phone]   [facility]  [phone]    Behavioral Health Resources in the Community: Intensive Outpatient Programs  Organization         Address  Phone  Notes  [facility] [address], [place], [place] [phone]   [facility] Outpatient [address], [place], [place] [phone]   [facility] [address], [place], [place]  [phone]   [facility] Mental Health [address]  [place], [place] [phone] or [phone]   Substance Abuse Resources Organization         Address  Phone  Notes  [facility] Drug Services  [phone]   [facility]  [phone]   [facility]  [phone]   Floydene Flock  [phone]   Residential & Outpatient Substance Abuse Program  [phone]   Psychological Services Organization         Address  Phone  Notes  [facility]  [phone]   [facility]  [phone]   [facility] Mental Health [address], [place] [phone] or [phone]    Mobile Crisis Teams Organization         Address  Phone  Notes  [facility], Mobile Crisis Care Unit  [phone]   [facility]  [address] [place], [place] [phone]   Doristine Locks [address] [place] [phone]    Self-Help/Support Groups Organization         Address  Phone             Notes  [facility] [place] - variety of support groups  [phone]- I7437963 Call for more information  Narcotics Anonymous (NA), [facility] [address], [employer] Walker  2 meetings at this location   Statistician         Address  Phone  Notes  [facility] [address],    [place]  [phone]   [facility]  [address] [place] [address], [place], [place] [phone]    [facility] [address] [place], [place] [phone] Admissions: 8am-3pm M-F  [facility] [address],    [place], [place] [phone]   [facility] [address] [place], [place], [place] [phone]   [facility] [address],  [place], [place] [phone]   [facility] - Intensive Outpatient [address]ure[address], [place], [place] [phone]   [facility] ([facility] [address] [place]  [place], [place] [phone] or [phone]   [facility] ([facility]) [address] [place], [place] [phone] Accepts Medicaid  [facility] [address],  [place] [phone] Substance Abuse/Addiction Treatment   [facility] Organization         Address  Phone  Notes  [facility]  [phone]   Angie Fava, PhD [address] Ervin Knack [place], [place]   [phone] or [phone]   [facility]al   [address] [place], [place] [phone]   [facility] [address], [place], [place] [phone] Insurance/Medicaid/sponsorship through [facility] [address]                                    [place], [place] [phone] Therapy/tele-psych/case  [facility] [address], [place] [phone]    Dr. Lolly Mustache  [phone]   [facility] Dept. 1) [address] [place] 2) [place] 3)  [address], [place] [phone]  [phone]   [place] [phone] or [phone] (After Hours)

## 2013-07-17 NOTE — ED Provider Notes (Signed)
CSN: [id number]     Arrival date & time 07/17/13  1423 History   First MD Initiated Contact with Patient 07/17/13 1648     Chief Complaint  Patient presents with  . Emesis     (Consider location/radiation/quality/duration/timing/severity/associated sxs/prior Treatment) Patient is a 43 y.o. male presenting with vomiting. The history is provided by the patient and medical records. No language interpreter was used.  Emesis Associated symptoms: abdominal pain   Associated symptoms: no diarrhea     Scott Hunt is a 43 y.o. male  with a hx of alcohol abuse presents to the Emergency Department complaining of gradual, persistent, progressively worsening nausea and vomiting onset 4 days ago. He reports associated epigastric soreness which began 24 hours after his first episode of emesis. He also reports constipation with no bowel movements in 3 days. He denies history of abdominal surgery, peptic ulcer disease. He denies blood in his emesis or bowel movements prior to becoming constipated. Patient reports he drinks 2, 24 ounce beers per day, smokes several cigarettes and denies street drugs. Patient also denies bilious emesis. Nothing makes it better or worse. He tried no over-the-counter treatment for this. He denies fever, chills, headache, neck pain, chest pain, shortness of breath, diarrhea, weakness, dizziness, syncope, dysuria, hematuria. He does report sick contacts at work but is unsure whether or not they had gastroenteritis.  Past Medical History  Diagnosis Date  . Alcohol abuse    History reviewed. No pertinent past surgical history. History reviewed. No pertinent family history. History  Substance Use Topics  . Smoking status: Current Every Day Smoker -- 0.50 packs/day    Types: Cigarettes  . Smokeless tobacco: Not on file  . Alcohol Use: Yes    Review of Systems  Constitutional: Negative for fever, diaphoresis, appetite change, fatigue and unexpected weight change.  HENT:  Negative for mouth sores and trouble swallowing.   Respiratory: Negative for cough, chest tightness, shortness of breath, wheezing and stridor.   Cardiovascular: Negative for chest pain and palpitations.  Gastrointestinal: Positive for nausea, vomiting, abdominal pain and constipation. Negative for diarrhea, blood in stool, abdominal distention and rectal pain.  Endocrine: Negative for polydipsia, polyphagia and polyuria.  Genitourinary: Negative for dysuria, urgency, frequency, hematuria, flank pain and difficulty urinating.  Musculoskeletal: Negative for back pain, neck pain and neck stiffness.  Skin: Negative for rash.  Allergic/Immunologic: Negative for immunocompromised state.  Neurological: Negative for weakness.  Hematological: Negative for adenopathy.  Psychiatric/Behavioral: Negative for confusion.  All other systems reviewed and are negative.      Allergies  Review of patient's allergies indicates no known allergies.  Home Medications   Current Outpatient Rx  Name  Route  Sig  Dispense  Refill  . polyethylene glycol (MIRALAX / GLYCOLAX) packet   Oral   Take 17 g by mouth daily.   14 each   0   . promethazine (PHENERGAN) 25 MG tablet   Oral   Take 1 tablet (25 mg total) by mouth every 6 (six) hours as needed for nausea or vomiting.   12 tablet   0    BP 120/80  Pulse 92  Temp(Src) 97.8 F (36.6 C) (Oral)  Resp 16  Wt 195 lb (88.451 kg)  SpO2 94% Physical Exam  Nursing note and vitals reviewed. Constitutional: He is oriented to person, place, and time. He appears well-developed and well-nourished. No distress.  Awake, alert, nontoxic appearance  HENT:  Head: Normocephalic and atraumatic.  Mouth/Throat: Oropharynx is clear and moist.  No oropharyngeal exudate.  Eyes: Conjunctivae are normal. No scleral icterus.  Neck: Normal range of motion. Neck supple.  Cardiovascular: Normal rate, regular rhythm, normal heart sounds and intact distal pulses.   No murmur  heard. No tachycardia  Pulmonary/Chest: Effort normal and breath sounds normal. No respiratory distress. He has no wheezes.  Clear and equal breath sounds Well healed scars to the anterior chest  Abdominal: Soft. Normal appearance and bowel sounds are normal. He exhibits no distension, no fluid wave, no ascites and no mass. There is tenderness in the epigastric area. There is no rebound, no guarding and no CVA tenderness.    Mild epigastric tenderness without guarding, rigidity or peritoneal signs No CVA tenderness No hepatosplenomegaly No distention, ascites or fluid wave  Musculoskeletal: Normal range of motion. He exhibits no edema.  Neurological: He is alert and oriented to person, place, and time. He exhibits normal muscle tone. Coordination normal.  Speech is clear and goal oriented Moves extremities without ataxia  Skin: Skin is warm and dry. No rash noted. He is not diaphoretic. No erythema.  Psychiatric: He has a normal mood and affect. His behavior is normal.    ED Course  Procedures (including critical care time) Labs Review Labs Reviewed  COMPREHENSIVE METABOLIC PANEL - Abnormal; Notable for the following:    Potassium 3.6 (*)    Chloride 95 (*)    Glucose, Bld 120 (*)    Alkaline Phosphatase 133 (*)    GFR calc non Af Amer 68 (*)    GFR calc Af Amer 78 (*)    All other components within normal limits  LIPASE, BLOOD - Abnormal; Notable for the following:    Lipase 88 (*)    All other components within normal limits  URINALYSIS, ROUTINE W REFLEX MICROSCOPIC - Abnormal; Notable for the following:    Color, Urine AMBER (*)    Specific Gravity, Urine 1.033 (*)    Bilirubin Urine SMALL (*)    Ketones, ur 15 (*)    Protein, ur 30 (*)    All other components within normal limits  URINE MICROSCOPIC-ADD ON - Abnormal; Notable for the following:    Casts HYALINE CASTS (*)    All other components within normal limits  CBC WITH DIFFERENTIAL   Imaging Review Dg Abd  Acute W/chest  07/17/2013   CLINICAL DATA:  Vomiting.  Lower abdominal pain and constipation.  EXAM: ACUTE ABDOMEN SERIES (ABDOMEN 2 VIEW & CHEST 1 VIEW)  COMPARISON:  None.  FINDINGS: There is no free air or free fluid in the abdomen. Bowel gas pattern is normal. Heart and lungs appear normal. There is a slight lumbar scoliosis. There is a calcification in the left side of the pelvis, most likely a phlebolith.  IMPRESSION: Benign appearing abdomen and chest.   Electronically Signed   By: Geanie Cooley M.D.   On: 07/17/2013 18:48     EKG Interpretation None      MDM   Final diagnoses:  Nausea & vomiting  Constipation   Scott Hunt presents with mild epigastric tenderness and 4 days of vomiting.  He reports no bowel movement in 3 days. On physical exam his abdomen soft without distention or rigidity, only mild epigastric tenderness and negative Murphy's sign.  Lab results reassuring without leukocytosis. Patient with slightly elevated lipase at 88 but he endorses history of alcoholism and chronic everyday alcohol use. UA with evidence of mild dehydration. We'll give fluids, nausea medication reassess.  7:48PM Patient with persistent  nausea but no emesis in the department. We'll give Phenergan and reassess.  She abdominal series no evidence of significant constipation or air fluid levels suggestive of obstruction. Patient without history of abdominal surgery.  His abdomen remained soft.  9:37 PM Patient with resolution of nausea. Feeling better. We'll by mouth trial.  Repeat abdominal exam is soft and nontender; resolution of epigastric pain.  10:27 PM Patient tolerates by mouth without further nausea or emesis. He ambulates without difficulty with steady gait.  We'll discharge home with Phenergan and MiraLax.  Discussed the need to find a primary care physician. Patient reports that his insurance will begin next week and he can do so. He was given resources to do this.  It has been  determined that no acute conditions requiring further emergency intervention are present at this time. The patient/guardian have been advised of the diagnosis and plan. We have discussed signs and symptoms that warrant return to the ED, such as changes or worsening in symptoms.   Vital signs are stable at discharge.   BP 120/80  Pulse 92  Temp(Src) 97.8 F (36.6 C) (Oral)  Resp 16  Wt 195 lb (88.451 kg)  SpO2 94%  Patient/guardian has voiced understanding and agreed to follow-up with the PCP or specialist.      Dierdre Forth, PA-C 07/17/13 2228

## 2013-07-17 NOTE — ED Provider Notes (Signed)
Medical screening examination/treatment/procedure(s) were performed by non-physician practitioner and as supervising physician I was immediately available for consultation/collaboration.   EKG Interpretation None        Gavin PoundMichael Y. Oletta LamasGhim, MD 07/17/13 2229

## 2013-07-17 NOTE — ED Notes (Signed)
Pt ambulated in hall.  Pt was a bit unsteady but would close his eyes.  When asked how he got here, he said he drove.  Pt told me his wife could come pick him up.

## 2013-07-17 NOTE — ED Notes (Signed)
Per pt sts vomiting and generalized abdominal pain x 3 days. sts last BM 3 days ago and was hard.

## 2013-07-17 NOTE — ED Notes (Signed)
MD at bedside. Patient requested and received a cup of water.

## 2013-07-18 ENCOUNTER — Encounter (HOSPITAL_COMMUNITY): Payer: Self-pay | Admitting: Emergency Medicine

## 2013-07-18 ENCOUNTER — Emergency Department (HOSPITAL_COMMUNITY): Payer: Self-pay

## 2013-07-18 ENCOUNTER — Inpatient Hospital Stay (HOSPITAL_COMMUNITY)
Admission: EM | Admit: 2013-07-18 | Discharge: 2013-07-20 | DRG: 392 | Disposition: A | Discharge status: Home / Self Care | Payer: Self-pay | Attending: Internal Medicine | Admitting: Internal Medicine

## 2013-07-18 DIAGNOSIS — E860 Dehydration: Secondary | ICD-10-CM | POA: Diagnosis present

## 2013-07-18 DIAGNOSIS — F1010 Alcohol abuse, uncomplicated: Secondary | ICD-10-CM | POA: Diagnosis present

## 2013-07-18 DIAGNOSIS — R509 Fever, unspecified: Secondary | ICD-10-CM | POA: Diagnosis present

## 2013-07-18 DIAGNOSIS — Z79899 Other long term (current) drug therapy: Secondary | ICD-10-CM

## 2013-07-18 DIAGNOSIS — R112 Nausea with vomiting, unspecified: Secondary | ICD-10-CM

## 2013-07-18 DIAGNOSIS — K625 Hemorrhage of anus and rectum: Secondary | ICD-10-CM | POA: Diagnosis not present

## 2013-07-18 DIAGNOSIS — B9789 Other viral agents as the cause of diseases classified elsewhere: Secondary | ICD-10-CM | POA: Diagnosis present

## 2013-07-18 DIAGNOSIS — R1110 Vomiting, unspecified: Secondary | ICD-10-CM

## 2013-07-18 DIAGNOSIS — K2970 Gastritis, unspecified, without bleeding: Secondary | ICD-10-CM

## 2013-07-18 DIAGNOSIS — R1115 Cyclical vomiting syndrome unrelated to migraine: Secondary | ICD-10-CM | POA: Diagnosis present

## 2013-07-18 DIAGNOSIS — K649 Unspecified hemorrhoids: Secondary | ICD-10-CM

## 2013-07-18 DIAGNOSIS — F17200 Nicotine dependence, unspecified, uncomplicated: Secondary | ICD-10-CM | POA: Diagnosis present

## 2013-07-18 DIAGNOSIS — K5900 Constipation, unspecified: Secondary | ICD-10-CM | POA: Diagnosis present

## 2013-07-18 DIAGNOSIS — K2920 Alcoholic gastritis without bleeding: Principal | ICD-10-CM | POA: Diagnosis present

## 2013-07-18 DIAGNOSIS — D649 Anemia, unspecified: Secondary | ICD-10-CM | POA: Diagnosis present

## 2013-07-18 DIAGNOSIS — K648 Other hemorrhoids: Secondary | ICD-10-CM | POA: Diagnosis present

## 2013-07-18 DIAGNOSIS — R030 Elevated blood-pressure reading, without diagnosis of hypertension: Secondary | ICD-10-CM | POA: Diagnosis present

## 2013-07-18 LAB — URINE MICROSCOPIC-ADD ON

## 2013-07-18 LAB — CBC WITH DIFFERENTIAL/PLATELET
Basophils Absolute: 0.1 10*3/uL (ref 0.0–0.1)
Basophils Relative: 1 % (ref 0–1)
EOS ABS: 0.1 10*3/uL (ref 0.0–0.7)
Eosinophils Relative: 1 % (ref 0–5)
HCT: 40.5 % (ref 39.0–52.0)
HEMOGLOBIN: 13.6 g/dL (ref 13.0–17.0)
Lymphocytes Relative: 35 % (ref 12–46)
Lymphs Abs: 1.8 10*3/uL (ref 0.7–4.0)
MCH: 29.6 pg (ref 26.0–34.0)
MCHC: 33.6 g/dL (ref 30.0–36.0)
MCV: 88.2 fL (ref 78.0–100.0)
Monocytes Absolute: 0.7 10*3/uL (ref 0.1–1.0)
Monocytes Relative: 13 % — ABNORMAL HIGH (ref 3–12)
NEUTROS ABS: 2.6 10*3/uL (ref 1.7–7.7)
NEUTROS PCT: 50 % (ref 43–77)
Platelets: 303 10*3/uL (ref 150–400)
RBC: 4.59 MIL/uL (ref 4.22–5.81)
RDW: 12.6 % (ref 11.5–15.5)
WBC: 5.2 10*3/uL (ref 4.0–10.5)

## 2013-07-18 LAB — URINALYSIS, ROUTINE W REFLEX MICROSCOPIC
BILIRUBIN URINE: NEGATIVE
GLUCOSE, UA: NEGATIVE mg/dL
HGB URINE DIPSTICK: NEGATIVE
Ketones, ur: NEGATIVE mg/dL
Leukocytes, UA: NEGATIVE
NITRITE: NEGATIVE
PH: 8.5 — AB (ref 5.0–8.0)
Protein, ur: 30 mg/dL — AB
Specific Gravity, Urine: 1.025 (ref 1.005–1.030)
Urobilinogen, UA: 1 mg/dL (ref 0.0–1.0)

## 2013-07-18 LAB — LIPASE, BLOOD: LIPASE: 41 U/L (ref 11–59)

## 2013-07-18 LAB — COMPREHENSIVE METABOLIC PANEL
ALBUMIN: 4 g/dL (ref 3.5–5.2)
ALK PHOS: 124 U/L — AB (ref 39–117)
ALT: 14 U/L (ref 0–53)
AST: 15 U/L (ref 0–37)
BILIRUBIN TOTAL: 0.7 mg/dL (ref 0.3–1.2)
BUN: 15 mg/dL (ref 6–23)
CHLORIDE: 99 meq/L (ref 96–112)
CO2: 27 mEq/L (ref 19–32)
Calcium: 9.3 mg/dL (ref 8.4–10.5)
Creatinine, Ser: 1.27 mg/dL (ref 0.50–1.35)
GFR calc Af Amer: 79 mL/min — ABNORMAL LOW (ref >90)
GFR calc non Af Amer: 68 mL/min — ABNORMAL LOW (ref >90)
Glucose, Bld: 109 mg/dL — ABNORMAL HIGH (ref 70–99)
POTASSIUM: 3.6 meq/L — AB (ref 3.7–5.3)
Sodium: 139 mEq/L (ref 137–147)
Total Protein: 7.6 g/dL (ref 6.0–8.3)

## 2013-07-18 MED ORDER — IOHEXOL 300 MG/ML  SOLN
100.0000 mL | Freq: Once | INTRAMUSCULAR | Status: AC | PRN
  Administered 2013-07-18: 100 mL via INTRAVENOUS

## 2013-07-18 MED ORDER — HYDROMORPHONE HCL PF 1 MG/ML IJ SOLN
1.0000 mg | Freq: Once | INTRAMUSCULAR | Status: AC
  Administered 2013-07-18: 1 mg via INTRAVENOUS
  Filled 2013-07-18: qty 1

## 2013-07-18 MED ORDER — ONDANSETRON HCL 4 MG/2ML IJ SOLN
4.0000 mg | Freq: Once | INTRAMUSCULAR | Status: AC
  Administered 2013-07-19: 4 mg via INTRAVENOUS
  Filled 2013-07-18: qty 2

## 2013-07-18 MED ORDER — LORAZEPAM 2 MG/ML IJ SOLN
0.5000 mg | Freq: Once | INTRAMUSCULAR | Status: AC
  Administered 2013-07-19: 0.5 mg via INTRAVENOUS
  Filled 2013-07-18: qty 1

## 2013-07-18 MED ORDER — PROMETHAZINE HCL 25 MG/ML IJ SOLN
25.0000 mg | Freq: Once | INTRAMUSCULAR | Status: AC
  Administered 2013-07-18: 25 mg via INTRAVENOUS
  Filled 2013-07-18: qty 1

## 2013-07-18 MED ORDER — SODIUM CHLORIDE 0.9 % IV BOLUS (SEPSIS)
1000.0000 mL | Freq: Once | INTRAVENOUS | Status: AC
  Administered 2013-07-18: 1000 mL via INTRAVENOUS

## 2013-07-18 MED ORDER — IOHEXOL 300 MG/ML  SOLN
50.0000 mL | Freq: Once | INTRAMUSCULAR | Status: AC | PRN
  Administered 2013-07-18: 50 mL via ORAL

## 2013-07-18 NOTE — ED Provider Notes (Signed)
CSN: [id number]     Arrival date & time 07/18/13  1739 History   First MD Initiated Contact with Patient 07/18/13 1751     Chief Complaint  Patient presents with  . Abdominal Pain  . Nausea  . Emesis     (Consider location/radiation/quality/duration/timing/severity/associated sxs/prior Treatment) The history is provided by the patient and medical records. No language interpreter was used.    Mansoor Hillyard is a 43 y.o. male  with a hx of alcohol abuse presents to the Emergency Department complaining of gradual, persistent, progressively worsening vomiting that began 5 days ago.  Patient was seen last night by myself the emergency department at [facility] with the same symptoms. He reports that he went home, did not fill his Phenergan prescription and then began to vomit again this afternoon. He denies alcohol ingestion today.  He continues to endorse nonbloody and nonbilious emesis. She has no history of abdominal surgery or peptic ulcer disease.  Prior to this episode he was drinking 2, 24 ounce beers per day smoking several cigarettes and continued to deny street drugs. He denies fever, chills, headache, neck pain, chest pain, shortness of breath, diarrhea, weakness, dizziness, syncope, dysuria, hematuria. He does report sick contacts at work but is unsure whether or not they had gastroenteritis.   Past Medical History  Diagnosis Date  . Alcohol abuse    History reviewed. No pertinent past surgical history. No family history on file. History  Substance Use Topics  . Smoking status: Current Every Day Smoker -- 0.50 packs/day    Types: Cigarettes  . Smokeless tobacco: Not on file  . Alcohol Use: Yes    Review of Systems  Constitutional: Negative for fever, diaphoresis, appetite change, fatigue and unexpected weight change.  HENT: Negative for mouth sores and trouble swallowing.   Respiratory: Negative for cough, chest tightness, shortness of breath, wheezing and stridor.     Cardiovascular: Negative for chest pain and palpitations.  Gastrointestinal: Positive for nausea, vomiting and abdominal pain (epigastric). Negative for diarrhea, constipation, blood in stool, abdominal distention and rectal pain.  Genitourinary: Negative for dysuria, urgency, frequency, hematuria, flank pain and difficulty urinating.  Musculoskeletal: Negative for back pain, neck pain and neck stiffness.  Skin: Negative for rash.  Neurological: Negative for weakness.  Hematological: Negative for adenopathy.  Psychiatric/Behavioral: Negative for confusion.  All other systems reviewed and are negative.      Allergies  Review of patient's allergies indicates no known allergies.  Home Medications   Current Outpatient Rx  Name  Route  Sig  Dispense  Refill  . promethazine (PHENERGAN) 25 MG tablet   Oral   Take 1 tablet (25 mg total) by mouth every 6 (six) hours as needed for nausea or vomiting.   12 tablet   0   . polyethylene glycol (MIRALAX / GLYCOLAX) packet   Oral   Take 17 g by mouth daily.   14 each   0    BP 161/75  Pulse 90  Temp(Src) 98.7 F (37.1 C) (Oral)  Resp 20  SpO2 98% Physical Exam  Nursing note and vitals reviewed. Constitutional: He is oriented to person, place, and time. He appears well-developed and well-nourished. No distress.  Awake, alert, nontoxic appearance  HENT:  Head: Normocephalic and atraumatic.  Right Ear: Tympanic membrane, external ear and ear canal normal.  Left Ear: Tympanic membrane, external ear and ear canal normal.  Nose: Nose normal. No epistaxis. Right sinus exhibits no maxillary sinus tenderness and no frontal  sinus tenderness. Left sinus exhibits no maxillary sinus tenderness and no frontal sinus tenderness.  Mouth/Throat: Uvula is midline, oropharynx is clear and moist and mucous membranes are normal. Mucous membranes are not pale and not cyanotic. No oropharyngeal exudate, posterior oropharyngeal edema, posterior  oropharyngeal erythema or tonsillar abscesses.  Dry mucous membranes  Eyes: Conjunctivae are normal. Pupils are equal, round, and reactive to light. No scleral icterus.  Neck: Normal range of motion and full passive range of motion without pain. Neck supple.  Cardiovascular: Normal rate, regular rhythm, normal heart sounds and intact distal pulses.   No murmur heard. Pulmonary/Chest: Effort normal and breath sounds normal. No stridor. No respiratory distress. He has no wheezes.  Clear and equal breath sounds  Abdominal: Soft. Bowel sounds are normal. He exhibits no distension and no mass. There is tenderness in the epigastric area. There is guarding. There is no rebound and no CVA tenderness.    Mild epigastric tenderness with guarding No rebound or peritoneal signs No CVA tenderness No distention  Musculoskeletal: Normal range of motion. He exhibits no edema.  Lymphadenopathy:    He has no cervical adenopathy.  Neurological: He is alert and oriented to person, place, and time. He exhibits normal muscle tone. Coordination normal.  Speech is clear and goal oriented Moves extremities without ataxia  Skin: Skin is warm and dry. No rash noted. He is not diaphoretic. No erythema.  Psychiatric: He has a normal mood and affect.    ED Course  Procedures (including critical care time) Labs Review Labs Reviewed  CBC WITH DIFFERENTIAL - Abnormal; Notable for the following:    Monocytes Relative 13 (*)    All other components within normal limits  COMPREHENSIVE METABOLIC PANEL - Abnormal; Notable for the following:    Potassium 3.6 (*)    Glucose, Bld 109 (*)    Alkaline Phosphatase 124 (*)    GFR calc non Af Amer 68 (*)    GFR calc Af Amer 79 (*)    All other components within normal limits  URINALYSIS, ROUTINE W REFLEX MICROSCOPIC - Abnormal; Notable for the following:    pH 8.5 (*)    Protein, ur 30 (*)    All other components within normal limits  LIPASE, BLOOD  URINE  MICROSCOPIC-ADD ON   Imaging Review Ct Abdomen Pelvis W Contrast  07/18/2013   CLINICAL DATA Progressive abdomen pain  EXAM CT ABDOMEN AND PELVIS WITH CONTRAST  TECHNIQUE Multidetector CT imaging of the abdomen and pelvis was performed using the standard protocol following bolus administration of intravenous contrast.  CONTRAST 50mL OMNIPAQUE IOHEXOL 300 MG/ML SOLN, OMNIPAQUE IOHEXOL 300 MG/ML SOLN  COMPARISON None.  FINDINGS The liver, spleen, pancreas, gallbladder, adrenal glands are normal. There are multiple simple cysts in bilateral kidneys, largest in the lower pole measuring 3.3 x 3.8 cm. The aorta is normal. There is no abdominal lymphadenopathy. There is no small bowel obstruction or diverticulitis. The appendix is normal.  Partial fluid-filled bladder is normal. Than probably any diverticulum extending off the superior aspect of right side bladder. There is mild dependent atelectasis of the posterior lung bases. There is scoliosis of spine. Ribs mild degenerative joint changes of spine are noted.  IMPRESSION No acute abnormality identified in the abdomen and pelvis.  SIGNATURE  Electronically Signed   By: Sherian Rein M.D.   On: 07/18/2013 21:11   Dg Abd Acute W/chest  07/17/2013   CLINICAL DATA:  Vomiting.  Lower abdominal pain and constipation.  EXAM:  ACUTE ABDOMEN SERIES (ABDOMEN 2 VIEW & CHEST 1 VIEW)  COMPARISON:  None.  FINDINGS: There is no free air or free fluid in the abdomen. Bowel gas pattern is normal. Heart and lungs appear normal. There is a slight lumbar scoliosis. There is a calcification in the left side of the pelvis, most likely a phlebolith.  IMPRESSION: Benign appearing abdomen and chest.   Electronically Signed   By: Geanie CooleyJim  Maxwell M.D.   On: 07/17/2013 18:48     EKG Interpretation None      MDM   Final diagnoses:  Intractable vomiting   Perlie GoldMarquis Vanbrocklin since the emergency department approximately 24 hours after being discharged with persistent nausea and  vomiting.  Patient continues to have epigastric tenderness without rigidity or rebound. If emesis continues to be nonbloody and nonbilious. He reports he never filled his prescription. Will give pain control, recheck labs as his lipase was elevated last night and obtain CT scan.  9:44 PM Labs reassuring and unremarkable. Lipase is decreased since last night. CT scan without acute abnormality. By mouth trial and reassess.  I personally reviewed the imaging tests through PACS system.  I reviewed available ER/hospitalization records through the EMR.     BP 161/75  Pulse 90  Temp(Src) 98.7 F (37.1 C) (Oral)  Resp 20  SpO2 98%  12:18 AM Pt continues to vomit in spite of repeat rounds of several rounds and types of antiemetics.  Will proceed with admission.  Discussed with Della GooHarvette jenkins, MD who will admit.    Dahlia ClientHannah Katlin Bortner, PA-C 07/19/13 0021

## 2013-07-18 NOTE — ED Notes (Signed)
Pt continues to c/o abdominal after fluid challenge state he vomited.

## 2013-07-18 NOTE — ED Notes (Addendum)
Per [facility]- [facility] presenting [facility]. Pt did not get RX filled states "does not help" Pt here for reevaluation. Pt reports has had HX of the same and these meds do not help. Zofran 4 mg  IVP given in route

## 2013-07-18 NOTE — ED Notes (Signed)
MD at bedside. EDPA PRESENT 

## 2013-07-19 ENCOUNTER — Encounter (HOSPITAL_COMMUNITY): Payer: Self-pay

## 2013-07-19 DIAGNOSIS — R1110 Vomiting, unspecified: Secondary | ICD-10-CM | POA: Diagnosis present

## 2013-07-19 DIAGNOSIS — R1115 Cyclical vomiting syndrome unrelated to migraine: Secondary | ICD-10-CM

## 2013-07-19 DIAGNOSIS — E860 Dehydration: Secondary | ICD-10-CM | POA: Diagnosis present

## 2013-07-19 DIAGNOSIS — F17200 Nicotine dependence, unspecified, uncomplicated: Secondary | ICD-10-CM

## 2013-07-19 DIAGNOSIS — F1010 Alcohol abuse, uncomplicated: Secondary | ICD-10-CM | POA: Diagnosis present

## 2013-07-19 DIAGNOSIS — R112 Nausea with vomiting, unspecified: Secondary | ICD-10-CM | POA: Insufficient documentation

## 2013-07-19 LAB — CBC
HCT: 37.5 % — ABNORMAL LOW (ref 39.0–52.0)
Hemoglobin: 12.5 g/dL — ABNORMAL LOW (ref 13.0–17.0)
MCH: 29.5 pg (ref 26.0–34.0)
MCHC: 33.3 g/dL (ref 30.0–36.0)
MCV: 88.4 fL (ref 78.0–100.0)
Platelets: 268 10*3/uL (ref 150–400)
RBC: 4.24 MIL/uL (ref 4.22–5.81)
RDW: 12.5 % (ref 11.5–15.5)
WBC: 6.4 10*3/uL (ref 4.0–10.5)

## 2013-07-19 LAB — BASIC METABOLIC PANEL
BUN: 13 mg/dL (ref 6–23)
CALCIUM: 8.7 mg/dL (ref 8.4–10.5)
CO2: 27 mEq/L (ref 19–32)
Chloride: 99 mEq/L (ref 96–112)
Creatinine, Ser: 1.21 mg/dL (ref 0.50–1.35)
GFR, EST AFRICAN AMERICAN: 84 mL/min — AB (ref >90)
GFR, EST NON AFRICAN AMERICAN: 72 mL/min — AB (ref >90)
Glucose, Bld: 98 mg/dL (ref 70–99)
POTASSIUM: 3.7 meq/L (ref 3.7–5.3)
SODIUM: 138 meq/L (ref 137–147)

## 2013-07-19 MED ORDER — LORAZEPAM 2 MG/ML IJ SOLN: 0.0000 mg | Freq: Two times a day (BID) | INTRAMUSCULAR | Status: DC

## 2013-07-19 MED ORDER — SODIUM CHLORIDE 0.9 % IV SOLN
INTRAVENOUS | Status: AC
  Administered 2013-07-19: 01:00:00 via INTRAVENOUS

## 2013-07-19 MED ORDER — VITAMIN B-1 100 MG PO TABS
100.0000 mg | ORAL_TABLET | Freq: Every day | ORAL | Status: DC
  Administered 2013-07-19 – 2013-07-20 (×2): 100 mg via ORAL
  Filled 2013-07-19 (×2): qty 1

## 2013-07-19 MED ORDER — BISACODYL 10 MG RE SUPP
10.0000 mg | Freq: Once | RECTAL | Status: AC
Start: 2013-07-19 — End: 2013-07-19
  Administered 2013-07-19: 10 mg via RECTAL
  Filled 2013-07-19: qty 1

## 2013-07-19 MED ORDER — ACETAMINOPHEN 325 MG PO TABS: 650.0000 mg | ORAL_TABLET | Freq: Four times a day (QID) | ORAL | Status: DC | PRN

## 2013-07-19 MED ORDER — ONDANSETRON HCL 4 MG/2ML IJ SOLN: 4.0000 mg | Freq: Three times a day (TID) | INTRAMUSCULAR | Status: AC | PRN

## 2013-07-19 MED ORDER — ALUM & MAG HYDROXIDE-SIMETH 200-200-20 MG/5ML PO SUSP: 30.0000 mL | Freq: Four times a day (QID) | ORAL | Status: DC | PRN

## 2013-07-19 MED ORDER — SODIUM CHLORIDE 0.9 % IV SOLN
INTRAVENOUS | Status: DC
  Administered 2013-07-19 – 2013-07-20 (×2): via INTRAVENOUS

## 2013-07-19 MED ORDER — THIAMINE HCL 100 MG/ML IJ SOLN
100.0000 mg | Freq: Every day | INTRAMUSCULAR | Status: DC
  Filled 2013-07-19 (×2): qty 1

## 2013-07-19 MED ORDER — PANTOPRAZOLE SODIUM 40 MG IV SOLR
40.0000 mg | Freq: Two times a day (BID) | INTRAVENOUS | Status: DC
  Administered 2013-07-19 (×2): 40 mg via INTRAVENOUS
  Filled 2013-07-19 (×4): qty 40

## 2013-07-19 MED ORDER — HYDROMORPHONE HCL PF 1 MG/ML IJ SOLN: 0.5000 mg | INTRAMUSCULAR | Status: DC | PRN

## 2013-07-19 MED ORDER — LORAZEPAM 1 MG PO TABS
1.0000 mg | ORAL_TABLET | Freq: Four times a day (QID) | ORAL | Status: DC | PRN
  Administered 2013-07-19: 1 mg via ORAL
  Filled 2013-07-19: qty 1

## 2013-07-19 MED ORDER — ENOXAPARIN SODIUM 40 MG/0.4ML ~~LOC~~ SOLN
40.0000 mg | SUBCUTANEOUS | Status: DC
  Administered 2013-07-19: 40 mg via SUBCUTANEOUS
  Filled 2013-07-19: qty 0.4

## 2013-07-19 MED ORDER — POLYETHYLENE GLYCOL 3350 17 G PO PACK
17.0000 g | PACK | Freq: Every day | ORAL | Status: DC
  Administered 2013-07-20: 17 g via ORAL
  Filled 2013-07-19: qty 1

## 2013-07-19 MED ORDER — ONDANSETRON HCL 4 MG PO TABS: 4.0000 mg | ORAL_TABLET | Freq: Four times a day (QID) | ORAL | Status: DC | PRN

## 2013-07-19 MED ORDER — METOPROLOL TARTRATE 1 MG/ML IV SOLN
5.0000 mg | Freq: Four times a day (QID) | INTRAVENOUS | Status: DC | PRN
  Filled 2013-07-19: qty 5

## 2013-07-19 MED ORDER — FOLIC ACID 1 MG PO TABS
1.0000 mg | ORAL_TABLET | Freq: Every day | ORAL | Status: DC
  Administered 2013-07-19 – 2013-07-20 (×2): 1 mg via ORAL
  Filled 2013-07-19 (×2): qty 1

## 2013-07-19 MED ORDER — LORAZEPAM 2 MG/ML IJ SOLN
0.0000 mg | Freq: Four times a day (QID) | INTRAMUSCULAR | Status: DC
  Administered 2013-07-19: 2 mg via INTRAVENOUS
  Filled 2013-07-19: qty 1

## 2013-07-19 MED ORDER — ONDANSETRON HCL 4 MG/2ML IJ SOLN: 4.0000 mg | Freq: Four times a day (QID) | INTRAMUSCULAR | Status: DC | PRN

## 2013-07-19 MED ORDER — LORAZEPAM 2 MG/ML IJ SOLN
1.0000 mg | Freq: Four times a day (QID) | INTRAMUSCULAR | Status: DC | PRN
  Administered 2013-07-19: 1 mg via INTRAVENOUS
  Filled 2013-07-19: qty 1

## 2013-07-19 MED ORDER — OXYCODONE HCL 5 MG PO TABS: 5.0000 mg | ORAL_TABLET | ORAL | Status: DC | PRN

## 2013-07-19 MED ORDER — ADULT MULTIVITAMIN W/MINERALS CH
1.0000 | ORAL_TABLET | Freq: Every day | ORAL | Status: DC
  Administered 2013-07-19 – 2013-07-20 (×2): 1 via ORAL
  Filled 2013-07-19 (×2): qty 1

## 2013-07-19 MED ORDER — ACETAMINOPHEN 650 MG RE SUPP: 650.0000 mg | Freq: Four times a day (QID) | RECTAL | Status: DC | PRN

## 2013-07-19 NOTE — Progress Notes (Signed)
Clinical Social Work Department BRIEF PSYCHOSOCIAL ASSESSMENT 07/19/2013  Patient:  Scott Hunt, Scott Hunt     Account Number:  [id number]     Admit date:  07/18/2013  Clinical Social Worker:  Earlie Server  Date/Time:  07/19/2013 11:00 AM  Referred by:  Physician  Date Referred:  07/19/2013 Referred for  Substance Abuse   Other Referral:   Interview type:  Patient Other interview type:    PSYCHOSOCIAL DATA Living Status:  FAMILY Admitted from facility:   Level of care:   Primary support name:  Levada Dy Primary support relationship to patient:  SPOUSE Degree of support available:   Adequate    CURRENT CONCERNS Current Concerns  Substance Abuse   Other Concerns:    SOCIAL WORK ASSESSMENT / PLAN CSW received referral in order to assess for substance use. CSW reviewed chart and met with patient at bedside. CSW introduced myself and explained role.    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 Patient states that he was not feeling well on Friday so asked for a few days off at work. Patient reports stress because he was supposed to return to work and is hopeful that he will not get fired. CSW spoke with patient about his home life and patient reports financial stress along with family stressors. Patient reports good relationship with wife but he feels overwhelmed at times. Patient open to discussing SA and emotions in more detail.    Patient reports he drinks alcohol in order to manage stress. Patient drinks about a 6 pack of beer throughout Monday to Friday and drinks about 4 24 oz beers on the weekends. Patient never blackouts or feels that he has to drink when he wakes up. Patient reports that alcohol has not affected his daily activities but is aware that MD does not want him to drink any longer. CSW and patient spoke about triggers in more detail and patient reports he does feel depressed about 3 days out of the week. Patient reports he isolates and  feels worthless. Patient eats and sleeps more when depressed and reports he often drinks more often when feeling depressed. Patient denies any current SI or HI and reports no previous attempts. Patient reports that he has never taken antidepressant medication and will consider treatment options. Patient reports he is unsure about therapy or medication management due to insurance purposes and not having much time for appointments. CSW provided patient with referrals to agencies that will accept patient without insurance. Patient agreeable to review list and think about his options.    CSW will continue to follow in order to provide support as needed.   Assessment/plan status:  Referral to [employer] Other assessment/ plan:   SBIRT   Information/referral to community resources:   Patient declined SA resources because he feels alcohol consumption is related to depression. Outpatient community referrals provided for [facility] treatment.    PATIENT'S/FAMILY'S RESPONSE TO PLAN OF CARE: Patient alert but avoids eye contact during assessment. Patient agreeable to discuss emotions and home life with CSW. Patient aware that alcohol use is not beneficial to physical health and agreeable to stop drinking. Patient does not feel that he is addicted to alcohol but feels that he uses alcohol to cope with depression. Patient aware that therapy could assist with developing positive coping skills. Patient reports he wants to be happier in order to feel better when at home with his family.  Patient agreeable to review information and for CSW to follow  up at later time.       Bowles,  [phone]

## 2013-07-19 NOTE — Progress Notes (Signed)
UR completed. Patient changed to inpatient- requiring IVF@ 100cc/hr  

## 2013-07-19 NOTE — Progress Notes (Signed)
Had some blood mixed in his stools.  Rectal exam:  No external hemorrhoids, but feels to have some internal hemorrhoids, no masses.  Brown stool on finger post-procedure, no blood.  Likely hemorrhoid bleeding from severe constipation (large caliber stool in toilet).  Continue stool softeners

## 2013-07-19 NOTE — Progress Notes (Signed)
[facility] PROGRESS NOTE  Perlie GoldMarquis Maund OZH:[id number] DOB: [birth year] DOA: 07/18/2013 PCP: Default, Provider, MD  Assessment/Plan  Intractable Vomiting- Possible Acute Gastroenteritis vs. Gastritis/PUD from EtOH abuse.  LFTs and lipase repeatedly normal.   -  Start protonix -  Anti-Emetics PRN -  Continue IVFs -  Advance to full liquid  Dehydration- resolving with IVF -  Continue IVF until tolerating PO  Alcohol Abuse- last EtOH was 6 days ago -  Continue CIWA protocol with IV Ativan.   Smoker- Counseled Re: tobacco Cessation, Nicotine Patch daily while inpatient.   Constipation, last BM 4 days ago -  Start with bisacodyl suppository -  Consider enema if no results  Elevated blood pressure, may be secondary to nausea and abdominal discomfort -  Trend -  Add prn metoprolol  Normocytic anemia, likely due to marrow suppression from ETOH and hemodilution  Diet:  FLD Access:  PIV IVF:  yes Proph:  lovenox  Code Status: full Family Communication: patient alone Disposition Plan: pending able to take medications and liquids by mouth   Consultants:  none  Procedures:  CT abd/pelvis  Antibiotics:  none   HPI/Subjective:  Still has some RUQ and epigastric abdominal pain that gets worse acutely with eating.  Nausea with less vomiting.  Last BM was 4 days ago.    Objective: Filed Vitals:   07/19/13 0126 07/19/13 0206 07/19/13 0410 07/19/13 0632  BP: 164/102 161/109 145/85 166/108  Pulse: 76 81 80 79  Temp: 98 F (36.7 C) 97.8 F (36.6 C) 98 F (36.7 C) 98.1 F (36.7 C)  TempSrc: Oral Oral Oral Oral  Resp: 20 20 16 16   Height: 6\' 2"  (1.88 m)     Weight: 86.1 kg (189 lb 13.1 oz)     SpO2: 98% 99% 97% 97%    Intake/Output Summary (Last 24 hours) at 07/19/13 1223 Last data filed at 07/19/13 0850  Gross per 24 hour  Intake    240 ml  Output      0 ml  Net    240 ml   Filed Weights   07/19/13 0126  Weight: 86.1 kg (189 lb 13.1 oz)     Exam:   General:  BM, No acute distress  HEENT:  NCAT, MMM  Cardiovascular:  RRR, nl S1, S2 no mrg, 2+ pulses, warm extremities  Respiratory:  CTAB, no increased WOB  Abdomen:   NABS, soft, ND, mild TTP in the epigastrium and RUQ  MSK:   Normal tone and bulk, no LEE  Neuro:  Grossly intact  Data Reviewed: Basic Metabolic Panel:  Recent Labs Lab 07/17/13 1434 07/18/13 1800 07/19/13 0525  NA 139 139 138  K 3.6* 3.6* 3.7  CL 95* 99 99  CO2 31 27 27   GLUCOSE 120* 109* 98  BUN 20 15 13   CREATININE 1.28 1.27 1.21  CALCIUM 9.6 9.3 8.7   Liver Function Tests:  Recent Labs Lab 07/17/13 1434 07/18/13 1800  AST 19 15  ALT 19 14  ALKPHOS 133* 124*  BILITOT 0.7 0.7  PROT 8.1 7.6  ALBUMIN 4.3 4.0    Recent Labs Lab 07/17/13 1434 07/18/13 1800  LIPASE 88* 41   No results found for this basename: AMMONIA,  in the last 168 hours CBC:  Recent Labs Lab 07/17/13 1434 07/18/13 1800 07/19/13 0525  WBC 6.0 5.2 6.4  NEUTROABS 3.1 2.6  --   HGB 14.8 13.6 12.5*  HCT 42.4 40.5 37.5*  MCV 88.3 88.2 88.4  PLT 296  303 268   Cardiac Enzymes: No results found for this basename: CKTOTAL, CKMB, CKMBINDEX, TROPONINI,  in the last 168 hours BNP (last 3 results) No results found for this basename: PROBNP,  in the last 8760 hours CBG: No results found for this basename: GLUCAP,  in the last 168 hours  No results found for this or any previous visit (from the past 240 hour(s)).   Studies: Ct Abdomen Pelvis W Contrast  07/18/2013   CLINICAL DATA Progressive abdomen pain  EXAM CT ABDOMEN AND PELVIS WITH CONTRAST  TECHNIQUE Multidetector CT imaging of the abdomen and pelvis was performed using the standard protocol following bolus administration of intravenous contrast.  CONTRAST 50mL OMNIPAQUE IOHEXOL 300 MG/ML SOLN, OMNIPAQUE IOHEXOL 300 MG/ML SOLN  COMPARISON None.  FINDINGS The liver, spleen, pancreas, gallbladder, adrenal glands are normal. There are multiple  simple cysts in bilateral kidneys, largest in the lower pole measuring 3.3 x 3.8 cm. The aorta is normal. There is no abdominal lymphadenopathy. There is no small bowel obstruction or diverticulitis. The appendix is normal.  Partial fluid-filled bladder is normal. Than probably any diverticulum extending off the superior aspect of right side bladder. There is mild dependent atelectasis of the posterior lung bases. There is scoliosis of spine. Ribs mild degenerative joint changes of spine are noted.  IMPRESSION No acute abnormality identified in the abdomen and pelvis.  SIGNATURE  Electronically Signed   By: Sherian Rein M.D.   On: 07/18/2013 21:11   Dg Abd Acute W/chest  07/17/2013   CLINICAL DATA:  Vomiting.  Lower abdominal pain and constipation.  EXAM: ACUTE ABDOMEN SERIES (ABDOMEN 2 VIEW & CHEST 1 VIEW)  COMPARISON:  None.  FINDINGS: There is no free air or free fluid in the abdomen. Bowel gas pattern is normal. Heart and lungs appear normal. There is a slight lumbar scoliosis. There is a calcification in the left side of the pelvis, most likely a phlebolith.  IMPRESSION: Benign appearing abdomen and chest.   Electronically Signed   By: Geanie Cooley M.D.   On: 07/17/2013 18:48    Scheduled Meds: . sodium chloride   Intravenous STAT  . enoxaparin (LOVENOX) injection  40 mg Subcutaneous Q24H  . folic acid  1 mg Oral Daily  . LORazepam  0-4 mg Intravenous Q6H   Followed by  . [START ON 07/21/2013] LORazepam  0-4 mg Intravenous Q12H  . multivitamin with minerals  1 tablet Oral Daily  . thiamine  100 mg Oral Daily   Or  . thiamine  100 mg Intravenous Daily   Continuous Infusions: . sodium chloride 100 mL/hr at 07/19/13 0130    Principal Problem:   Intractable vomiting Active Problems:   Dehydration   Alcohol abuse   Smoker   Nausea and vomiting    Time spent: 30 min    Adrijana Haros, [facility]  [facility] Pager [phone]. If 7PM-7AM, please contact night-coverage at [email or web address],  password [facility] 07/19/2013, 12:23 PM  LOS: 1 day

## 2013-07-19 NOTE — Progress Notes (Signed)
Pt arrived to floor room 1512 via stretcher . VS taken pt arousable. Oriented to room/ call bell, no complications. Initial assessment completed. Not lucid enough for video at this time. General weakness and fatigue. C/o nausea 0/10 pain. Will continue to monitor thru shift

## 2013-07-19 NOTE — H&P (Signed)
[facility] History and Physical  Scott Hunt ZOX:[id number] DOB: 1970/08/02 DOA: 07/18/2013  Referring physician:  PCP: Default, Provider, MD  Specialists:   Chief Complaint: Nausea and Vomiting and ABD Pain  HPI: Scott Hunt is a 43 y.o. male with a history of Alcohol abuse who presents to the ED with complaints of nausea and vomiting and Epigastric ABD Pain x 5 days.  He report having 8/10 burning pain at he worse.  He was seen in the ED 1 days ago for the same symptoms and was treated and had mild relief, but he returned to the ED today with worsening symptoms and has not been able to hold down any foods or liquids.  He also reports having fevers and chills.   He denies diarrhea, and reports having constipation x 2 days.      Review of Systems:  Constitutional: No Weight Loss, No Weight Gain, Night Sweats, +Fevers, +Chills, Fatigue, or Generalized Weakness HEENT: No Headaches, Difficulty Swallowing,Tooth/Dental Problems,Sore Throat,  No Sneezing, Rhinitis, Ear Ache, Nasal Congestion, or Post Nasal Drip,  Cardio-vascular:  No Chest pain, Orthopnea, PND, Edema in lower extremities, Anasarca, Dizziness, Palpitations  Resp: No Dyspnea, No DOE, No Productive Cough, No Non-Productive Cough, No Hemoptysis, No Change in Color of Mucus,  No Wheezing.    GI: No Heartburn, Indigestion, +Abdominal Pain, +Nausea, +Vomiting, +Constipation, Diarrhea, Change in Bowel Habits,  +Loss of Appetite  GU: No Dysuria, Change in Color of Urine, No Urgency or Frequency.  No flank pain.  Musculoskeletal: No Joint Pain or Swelling.  No Decreased Range of Motion. No Back Pain.  Neurologic: No Syncope, No Seizures, Muscle Weakness, Paresthesia, Vision Disturbance or Loss, No Diplopia, No Vertigo, No Difficulty Walking,  Skin: No Rash or Lesions. Psych: No Change in Mood or Affect. No Depression or Anxiety. No Memory loss. No Confusion or Hallucinations   Past Medical History  Diagnosis Date  .  Alcohol abuse       History reviewed. No pertinent past surgical history.     Prior to Admission medications   Medication Sig Start Date End Date Taking? Authorizing Provider  promethazine (PHENERGAN) 25 MG tablet Take 1 tablet (25 mg total) by mouth every 6 (six) hours as needed for nausea or vomiting. 07/17/13  Yes Hannah Muthersbaugh, PA-C  polyethylene glycol (MIRALAX / GLYCOLAX) packet Take 17 g by mouth daily. 07/17/13   Hannah Muthersbaugh, PA-C      No Known Allergies   Social History:  reports that he has been smoking Cigarettes.  He has been smoking about 0.50 packs per day. He does not have any smokeless tobacco history on file. He reports that he drinks alcohol. His drug history is not on file.     No family history on file.     Physical Exam:  GEN:  Pleasant Ill appearing Lean but well developed  43 y.o.  African American male  examined  and in no acute distress; cooperative with exam Filed Vitals:   07/18/13 1738 07/18/13 1740  BP: 161/75   Pulse: 90   Temp: 98.7 F (37.1 C)   TempSrc: Oral   Resp: 20   SpO2: 98% 98%   Blood pressure 161/75, pulse 90, temperature 98.7 F (37.1 C), temperature source Oral, resp. rate 20, SpO2 98.00%. PSYCH: He is alert and oriented x4; does not appear anxious does not appear depressed; affect is normal HEENT: Normocephalic and Atraumatic, Mucous membranes pink; PERRLA; EOM intact; Fundi:  Benign;  No scleral icterus, Nares: Patent,  Oropharynx: Clear, Fair Dentition, Neck:  FROM, no cervical lymphadenopathy nor thyromegaly or carotid bruit; no JVD; Breasts:: Not examined CHEST WALL: No tenderness CHEST: Normal respiration, clear to auscultation bilaterally HEART: Regular rate and rhythm; no murmurs rubs or gallops BACK: No kyphosis or scoliosis; no CVA tenderness ABDOMEN: Positive Bowel Sounds, Scaphoid, soft Mildly tender in the epigastrium; no masses, no organomegaly. Rectal Exam: Not done EXTREMITIES: No cyanosis, clubbing  or edema; no ulcerations. Genitalia: not examined PULSES: 2+ and symmetric SKIN: Normal hydration no rash or ulceration CNS:  Alert and Oriented x 4, No Focal Deficits.    Vascular: pulses palpable throughout    Labs on Admission:  Basic Metabolic Panel:  Recent Labs Lab 07/17/13 1434 07/18/13 1800  NA 139 139  K 3.6* 3.6*  CL 95* 99  CO2 31 27  GLUCOSE 120* 109*  BUN 20 15  CREATININE 1.28 1.27  CALCIUM 9.6 9.3   Liver Function Tests:  Recent Labs Lab 07/17/13 1434 07/18/13 1800  AST 19 15  ALT 19 14  ALKPHOS 133* 124*  BILITOT 0.7 0.7  PROT 8.1 7.6  ALBUMIN 4.3 4.0    Recent Labs Lab 07/17/13 1434 07/18/13 1800  LIPASE 88* 41   No results found for this basename: AMMONIA,  in the last 168 hours CBC:  Recent Labs Lab 07/17/13 1434 07/18/13 1800  WBC 6.0 5.2  NEUTROABS 3.1 2.6  HGB 14.8 13.6  HCT 42.4 40.5  MCV 88.3 88.2  PLT 296 303   Cardiac Enzymes: No results found for this basename: CKTOTAL, CKMB, CKMBINDEX, TROPONINI,  in the last 168 hours  BNP (last 3 results) No results found for this basename: PROBNP,  in the last 8760 hours CBG: No results found for this basename: GLUCAP,  in the last 168 hours  Radiological Exams on Admission: Ct Abdomen Pelvis W Contrast  07/18/2013   CLINICAL DATA Progressive abdomen pain  EXAM CT ABDOMEN AND PELVIS WITH CONTRAST  TECHNIQUE Multidetector CT imaging of the abdomen and pelvis was performed using the standard protocol following bolus administration of intravenous contrast.  CONTRAST 50mL OMNIPAQUE IOHEXOL 300 MG/ML SOLN, OMNIPAQUE IOHEXOL 300 MG/ML SOLN  COMPARISON None.  FINDINGS The liver, spleen, pancreas, gallbladder, adrenal glands are normal. There are multiple simple cysts in bilateral kidneys, largest in the lower pole measuring 3.3 x 3.8 cm. The aorta is normal. There is no abdominal lymphadenopathy. There is no small bowel obstruction or diverticulitis. The appendix is normal.  Partial  fluid-filled bladder is normal. Than probably any diverticulum extending off the superior aspect of right side bladder. There is mild dependent atelectasis of the posterior lung bases. There is scoliosis of spine. Ribs mild degenerative joint changes of spine are noted.  IMPRESSION No acute abnormality identified in the abdomen and pelvis.  SIGNATURE  Electronically Signed   By: Sherian Rein M.D.   On: 07/18/2013 21:11   Dg Abd Acute W/chest  07/17/2013   CLINICAL DATA:  Vomiting.  Lower abdominal pain and constipation.  EXAM: ACUTE ABDOMEN SERIES (ABDOMEN 2 VIEW & CHEST 1 VIEW)  COMPARISON:  None.  FINDINGS: There is no free air or free fluid in the abdomen. Bowel gas pattern is normal. Heart and lungs appear normal. There is a slight lumbar scoliosis. There is a calcification in the left side of the pelvis, most likely a phlebolith.  IMPRESSION: Benign appearing abdomen and chest.   Electronically Signed   By: Geanie Cooley M.D.   On: 07/17/2013 18:48  Assessment/Plan:   42 y.o. male with  Principal Problem:   Intractable vomit52ing Active Problems:   Dehydration   Alcohol abuse   Smoker    1.    Intractable Vomiting-   Possible Acute Gastroenteritis,   Anti-Emetics PRN, and IVFs for Rehydration.   Clear liquids , Advance as Tolerated.    2.    Dehydration-   IVFs  For Rehydration, monitor Electrolytes and BUN/Cr.     3.    Alcohol Abuse-   CIWA protocol with IV Ativan.    4.    Smoker-  Counseled Re: tobacco Cessation,  Nicotine Patch daily while inpatient.    5.    DVT prophylaxis with Lovenox.        Code Status:    FULL CODE   Family Communication:    No Family Present Disposition Plan:     Observation    Time spent:  4560 Minutes  Ron ParkerJENKINS,Wasif Simonich C [facility] Pager [phone]  If 7PM-7AM, please contact night-coverage [email or web address] Password [email or web address] 07/19/2013, 12:43 AM

## 2013-07-19 NOTE — ED Notes (Signed)
Attempted to call report to floor RN unavailable at this time but will return phone call. 

## 2013-07-20 DIAGNOSIS — K2990 Gastroduodenitis, unspecified, without bleeding: Secondary | ICD-10-CM

## 2013-07-20 DIAGNOSIS — K649 Unspecified hemorrhoids: Secondary | ICD-10-CM

## 2013-07-20 DIAGNOSIS — K625 Hemorrhage of anus and rectum: Secondary | ICD-10-CM

## 2013-07-20 DIAGNOSIS — K5900 Constipation, unspecified: Secondary | ICD-10-CM

## 2013-07-20 DIAGNOSIS — K2970 Gastritis, unspecified, without bleeding: Secondary | ICD-10-CM

## 2013-07-20 LAB — CBC
HEMATOCRIT: 34.6 % — AB (ref 39.0–52.0)
Hemoglobin: 11.7 g/dL — ABNORMAL LOW (ref 13.0–17.0)
MCH: 29.6 pg (ref 26.0–34.0)
MCHC: 33.8 g/dL (ref 30.0–36.0)
MCV: 87.6 fL (ref 78.0–100.0)
Platelets: 257 10*3/uL (ref 150–400)
RBC: 3.95 MIL/uL — AB (ref 4.22–5.81)
RDW: 12.3 % (ref 11.5–15.5)
WBC: 4.9 10*3/uL (ref 4.0–10.5)

## 2013-07-20 LAB — BASIC METABOLIC PANEL
BUN: 11 mg/dL (ref 6–23)
CALCIUM: 8.3 mg/dL — AB (ref 8.4–10.5)
CHLORIDE: 102 meq/L (ref 96–112)
CO2: 25 meq/L (ref 19–32)
CREATININE: 1.15 mg/dL (ref 0.50–1.35)
GFR calc Af Amer: 89 mL/min — ABNORMAL LOW (ref >90)
GFR calc non Af Amer: 77 mL/min — ABNORMAL LOW (ref >90)
Glucose, Bld: 90 mg/dL (ref 70–99)
Potassium: 3.5 mEq/L — ABNORMAL LOW (ref 3.7–5.3)
Sodium: 139 mEq/L (ref 137–147)

## 2013-07-20 LAB — LIPASE, BLOOD: Lipase: 51 U/L (ref 11–59)

## 2013-07-20 MED ORDER — ADULT MULTIVITAMIN W/MINERALS CH: 1.0000 | ORAL_TABLET | Freq: Every day | ORAL | Status: DC

## 2013-07-20 MED ORDER — PANTOPRAZOLE SODIUM 40 MG PO TBEC
40.0000 mg | DELAYED_RELEASE_TABLET | Freq: Two times a day (BID) | ORAL | Status: DC
Start: 2013-07-20 — End: 2013-07-20

## 2013-07-20 MED ORDER — OMEPRAZOLE MAGNESIUM 20 MG PO TBEC: 20.0000 mg | DELAYED_RELEASE_TABLET | Freq: Every day | ORAL | Status: DC

## 2013-07-20 MED ORDER — POLYETHYLENE GLYCOL 3350 17 G PO PACK: 17.0000 g | PACK | Freq: Every day | ORAL | Status: DC

## 2013-07-20 MED ORDER — POTASSIUM CHLORIDE CRYS ER 20 MEQ PO TBCR
40.0000 meq | EXTENDED_RELEASE_TABLET | Freq: Once | ORAL | Status: AC
  Administered 2013-07-20: 40 meq via ORAL
  Filled 2013-07-20 (×2): qty 2

## 2013-07-20 NOTE — Progress Notes (Signed)
Pt was given d/c instructions and AVS. Pt verbalized understanding of all meds and given information from social work regarding Community groups for alcohol abuse. Pt is otherwise stable, pt was walked down to the front of the hospital for discharge. Pt has had no new changes ince morning assesmnent. Pt was thankful for care he received.   MCCLAIN, Lorraine Cimmino L 07/20/2013 1:32 PM

## 2013-07-20 NOTE — Progress Notes (Signed)
Clinical Social Work  CSW met with patient in order to provide support during hospital stay. Patient reports that he is feeling better today and much more alert. Patient reports after considering options last night he feels he wants SA and MH treatment. Patient reports that he has been drinking for several years and that alcohol has almost ruined his marriage. Patient reports he has been sober off and on but when he is overwhelmed, depressed, or deaths in the family he begins drinking again. Patient will follow up for [place] treatment in order to discuss deaths and grief process. Patient reports he works in [place], [place] and is interested in [place] treatment throughout the week. CSW provided patient with AA meeting lists for [place] and near his job in [place]. Patient reports he feels with extra support he can remain sober. Patient thanked CSW for information and is agreeable to follow up at [place].  [place],  [phone]

## 2013-07-20 NOTE — Discharge Summary (Addendum)
Physician Discharge Summary  Scott Hunt EPP:[id number] DOB: [birth year] DOA: 07/18/2013  PCP: Default, Provider, MD  Admit date: 07/18/2013 Discharge date: 07/20/2013  Recommendations for Outpatient Follow-up:  1. Follow up with primary care doctor within 2 weeks.  Repeat CBC and anemia work up please.  Consider referral to gastroenterology for anemia and blood in stools.   2. Given resources for local alcohol abstinence programs  Discharge Diagnoses:  Principal Problem:   Gastritis Active Problems:   Intractable vomiting   Dehydration   Alcohol abuse   Smoker   Rectal bleeding   Hemorrhoid   Unspecified constipation   Discharge Condition: stable, improved  Diet recommendation: regular  Wt Readings from Last 3 Encounters:  07/19/13 86.1 kg (189 lb 13.1 oz)  07/17/13 88.451 kg (195 lb)    History of present illness:  Scott Hunt is a 43 y.o. male with a history of Alcohol abuse who presents to the ED with complaints of nausea and vomiting and Epigastric ABD Pain x 5 days. He report having 8/10 burning pain at he worse. He was seen in the ED 1 days ago for the same symptoms and was treated and had mild relief, but he returned to the ED today with worsening symptoms and has not been able to hold down any foods or liquids. He also reports having fevers and chills. He denies diarrhea, and reports having constipation x 2 days.   Hospital Course:   Gastritis, likely a combination of alcoholic gastritis plus viral illness as several of his coworkers have been out sick with GE recently.  LFTs and lipase were repeatedly normal.  UA was negative.  CT abd/pelvis demonstrated no acute changes.  He was started on IV fluids and antiemetics and PPI and his symptoms resolved.  He was given information about gastritis and advised to not use NSAIDS or EtOH.  He should continue omeprazole once daily.    Dehydration- resolved with IVF.    Alcohol Abuse- last EtOH was 4 days prior to  admission.  He was started on CIWA protocol, however, he has not required ativan in the last 24 hours and is now 7 days post last drink.  He met with social work about local resources for alcohol cessation and abstinence.    Smoker- Counseled tobacco cessation.  Recommend he talk to his primary care doctor about quitting smoking.    Constipation, resolved with bisacodyl and docusate.  He should continue to use docusate and miralax as needed to keep his bowel movements regular.    Elevated blood pressure, may be secondary to nausea and abdominal discomfort and resolved.  He should have his primary care doctor repeat his BP at his follow up appointment.    Normocytic anemia, likely due to marrow suppression from ETOH and hemodilution, however, he also had some blood in his stools.  Rectal exam demonstrated some internal hemorrhoids.  He should have repeat CBC done at his follow up appointment and referral to GI for colonoscopy.  Further anemia work up deferred to PCP.     Consultants:  none Procedures:  CT abd/pelvis Antibiotics:  none    Discharge Exam: Filed Vitals:   07/20/13 1005  BP: 132/82  Pulse: 89  Temp: 98 F (36.7 C)  Resp: 18   Filed Vitals:   07/19/13 2117 07/20/13 0156 07/20/13 0500 07/20/13 1005  BP: 125/74 123/83 136/84 132/82  Pulse: 78 66 72 89  Temp: 98.5 F (36.9 C) 98.3 F (36.8 C) 98.1 F (36.7 C) 98  F (36.7 C)  TempSrc: Oral Oral Oral Oral  Resp: '16 16 18 18  ' Height:      Weight:      SpO2: 99% 99% 99% 99%   General: BM, No acute distress  HEENT: NCAT, MMM  Cardiovascular: RRR, nl S1, S2 no mrg, 2+ pulses, warm extremities  Respiratory: CTAB, no increased WOB  Abdomen: NABS, soft, ND/NT MSK: Normal tone and bulk, no LEE  Neuro: Grossly intact   Discharge Instructions      Discharge Orders   Future Orders Complete By Expires   Call MD for:  difficulty breathing, headache or visual disturbances  As directed    Call MD for:  extreme  fatigue  As directed    Call MD for:  hives  As directed    Call MD for:  persistant dizziness or light-headedness  As directed    Call MD for:  persistant nausea and vomiting  As directed    Call MD for:  severe uncontrolled pain  As directed    Call MD for:  temperature >100.4  As directed    Diet general  As directed    Discharge instructions  As directed    Comments:     You were hospitalized with nausea and vomiting.  You may have had some gastritis or inflammation of the stomach from a virus and alcohol use.  Please stop using alcohol and take acid reducing medication to allow your stomach to heal:  Omeprazole 32m once a day is available over the counter.  Please use over the counter docusate and miralax to prevent constipation and talk to your doctor about the blood in your stools.  Most likely your bleeding is from hemorrhoids, however, your primary care doctor may give you a referral to gastroenterology for colonoscopy to make sure there is nothing else causing bleeding.  Please talk to your doctor, behavioral health, and local resources regarding alcohol abstinence. [place].   Increase activity slowly  As directed        Medication List         multivitamin with minerals Tabs tablet  Take 1 tablet by mouth daily.     omeprazole 20 MG tablet  Commonly known as:  PRILOSEC OTC  Take 1 tablet (20 mg total) by mouth daily.     polyethylene glycol packet  Commonly known as:  MIRALAX / GLYCOLAX  Take 17 g by mouth daily.     promethazine 25 MG tablet  Commonly known as:  PHENERGAN  Take 1 tablet (25 mg total) by mouth every 6 (six) hours as needed for nausea or vomiting.       Follow-up Information   Follow up with [facility] Schedule an appointment as soon as possible for a visit in 2 weeks. (As needed)    Specialty:  Pediatrics   Contact information:   [address] [phone]      [facility] (As needed)    Specialty:  Behavioral Health   Contact information:   [phone]     [facility] (will need referral from primary care doctor)    Contact information:   [address] [phone]  The results of significant diagnostics from this hospitalization (including imaging, microbiology, ancillary and laboratory) are listed below for reference.    Significant Diagnostic Studies: Ct Abdomen Pelvis W Contrast  07/18/2013   CLINICAL DATA Progressive abdomen pain  EXAM CT ABDOMEN AND PELVIS WITH CONTRAST  TECHNIQUE Multidetector CT imaging of the abdomen and pelvis was performed using the standard protocol following bolus administration of intravenous contrast.  CONTRAST 94m OMNIPAQUE IOHEXOL 300 MG/ML SOLN, 1022mOMNIPAQUE IOHEXOL 300 MG/ML SOLN  COMPARISON None.  FINDINGS The liver, spleen, pancreas, gallbladder, adrenal glands are normal. There are multiple simple cysts in bilateral kidneys, largest in the lower pole measuring 3.3 x 3.8 cm. The aorta is normal. There is no abdominal lymphadenopathy. There is no small bowel obstruction or diverticulitis. The appendix is normal.  Partial fluid-filled bladder is normal. Than probably any diverticulum extending off the superior aspect of right side bladder. There is mild dependent atelectasis of the posterior lung bases. There is scoliosis of spine. Ribs mild degenerative joint changes of spine are noted.  IMPRESSION No acute abnormality identified in the abdomen and pelvis.  SIGNATURE  Electronically Signed   By: WeAbelardo Diesel.D.   On: 07/18/2013 21:11   Dg Abd Acute W/chest  07/17/2013   CLINICAL DATA:  Vomiting.  Lower abdominal pain and constipation.  EXAM: ACUTE ABDOMEN SERIES (ABDOMEN 2  VIEW & CHEST 1 VIEW)  COMPARISON:  None.  FINDINGS: There is no free air or free fluid in the abdomen. Bowel gas pattern is normal. Heart and lungs appear normal. There is a slight lumbar scoliosis. There is a calcification in the left side of the pelvis, most likely a phlebolith.  IMPRESSION: Benign appearing abdomen and chest.   Electronically Signed   By: JiRozetta Nunnery.D.   On: 07/17/2013 18:48    Microbiology: No results found for this or any previous visit (from the past 240 hour(s)).   Labs: Basic Metabolic Panel:  Recent Labs Lab 07/17/13 1434 07/18/13 1800 07/19/13 0525 07/20/13 0446  NA 139 139 138 139  K 3.6* 3.6* 3.7 3.5*  CL 95* 99 99 102  CO2 '31 27 27 25  ' GLUCOSE 120* 109* 98 90  BUN '20 15 13 11  ' CREATININE 1.28 1.27 1.21 1.15  CALCIUM 9.6 9.3 8.7 8.3*   Liver Function Tests:  Recent Labs Lab 07/17/13 1434 07/18/13 1800  AST 19 15  ALT 19 14  ALKPHOS 133* 124*  BILITOT 0.7 0.7  PROT 8.1 7.6  ALBUMIN 4.3 4.0    Recent Labs Lab 07/17/13 1434 07/18/13 1800 07/20/13 0446  LIPASE 88* 41 51   No results found for this basename: AMMONIA,  in the last 168 hours CBC:  Recent Labs Lab 07/17/13 1434 07/18/13 1800 07/19/13 0525 07/20/13 0446  WBC 6.0 5.2 6.4 4.9  NEUTROABS 3.1 2.6  --   --   HGB 14.8 13.6 12.5* 11.7*  HCT 42.4 40.5 37.5* 34.6*  MCV 88.3 88.2 88.4 87.6  PLT 296 303 268 257   Cardiac Enzymes: No results found for this basename: CKTOTAL, CKMB, CKMBINDEX, TROPONINI,  in the last 168 hours BNP: BNP (last 3 results) No results found for this basename: PROBNP,  in the last 8760 hours CBG: No results found for this basename: GLUCAP,  in the last 168 hours  Time coordinating discharge: 35 minutes  Signed:  Danyetta Gillham  [facility] 07/20/2013, 12:53 PM

## 2013-07-20 NOTE — Progress Notes (Signed)
PHARMACIST - PHYSICIAN COMMUNICATION CONCERNING:  IV to Oral Route Change Policy  RECOMMENDATION: This patient is receiving Protonix by the intravenous route.  Based on criteria approved by the Pharmacy and Therapeutics Committee, Protonix is being converted to the equivalent oral dose form(s).  DESCRIPTION: These criteria include:  Has a documented ability to take oral medications (tolerating diet of full liquids or better or  Gastric tube feedings for >24 hours OR taking other scheduled oral medications for >24 hours).  Expected plan for continued treatment for at least 1 day.  If you have questions about this conversion, please contact the Pharmacy Department  []   [phone]( [phone] )  [facility] []   [phone]( [phone] )  [facility]  []   [phone]( [phone] )  [facility] [x]   [phone]( [phone] )  [facility]   Geoffry Paradisehuyvan Phan, PharmD, BCPS Pager: [phone] 8:21 AM Pharmacy #: [phone]

## 2013-07-21 NOTE — ED Provider Notes (Signed)
Medical screening examination/treatment/procedure(s) were performed by non-physician practitioner and as supervising physician I was immediately available for consultation/collaboration.   EKG Interpretation None        Ariell Gunnels T Yoniel Arkwright, MD 07/21/13 1152 

## 2013-09-20 ENCOUNTER — Encounter (HOSPITAL_COMMUNITY): Payer: Self-pay | Admitting: Emergency Medicine

## 2013-09-20 ENCOUNTER — Emergency Department (HOSPITAL_COMMUNITY)
Admission: EM | Admit: 2013-09-20 | Discharge: 2013-09-20 | Disposition: A | Discharge status: Home / Self Care | Payer: Self-pay | Attending: Emergency Medicine | Admitting: Emergency Medicine

## 2013-09-20 DIAGNOSIS — K2990 Gastroduodenitis, unspecified, without bleeding: Principal | ICD-10-CM

## 2013-09-20 DIAGNOSIS — K2970 Gastritis, unspecified, without bleeding: Secondary | ICD-10-CM | POA: Insufficient documentation

## 2013-09-20 DIAGNOSIS — R109 Unspecified abdominal pain: Secondary | ICD-10-CM

## 2013-09-20 DIAGNOSIS — F17200 Nicotine dependence, unspecified, uncomplicated: Secondary | ICD-10-CM | POA: Insufficient documentation

## 2013-09-20 LAB — CBC WITH DIFFERENTIAL/PLATELET
BASOS ABS: 0.1 10*3/uL (ref 0.0–0.1)
BASOS PCT: 1 % (ref 0–1)
EOS ABS: 0 10*3/uL (ref 0.0–0.7)
EOS PCT: 0 % (ref 0–5)
HEMATOCRIT: 43.7 % (ref 39.0–52.0)
Hemoglobin: 14.7 g/dL (ref 13.0–17.0)
Lymphocytes Relative: 23 % (ref 12–46)
Lymphs Abs: 1.1 10*3/uL (ref 0.7–4.0)
MCH: 30.8 pg (ref 26.0–34.0)
MCHC: 33.6 g/dL (ref 30.0–36.0)
MCV: 91.6 fL (ref 78.0–100.0)
MONO ABS: 0.4 10*3/uL (ref 0.1–1.0)
Monocytes Relative: 7 % (ref 3–12)
Neutro Abs: 3.2 10*3/uL (ref 1.7–7.7)
Neutrophils Relative %: 69 % (ref 43–77)
PLATELETS: 334 10*3/uL (ref 150–400)
RBC: 4.77 MIL/uL (ref 4.22–5.81)
RDW: 13.6 % (ref 11.5–15.5)
WBC: 4.7 10*3/uL (ref 4.0–10.5)

## 2013-09-20 LAB — COMPREHENSIVE METABOLIC PANEL
ALBUMIN: 4.1 g/dL (ref 3.5–5.2)
ALT: 14 U/L (ref 0–53)
AST: 17 U/L (ref 0–37)
Alkaline Phosphatase: 149 U/L — ABNORMAL HIGH (ref 39–117)
BUN: 18 mg/dL (ref 6–23)
CALCIUM: 9.4 mg/dL (ref 8.4–10.5)
CO2: 25 meq/L (ref 19–32)
Chloride: 102 mEq/L (ref 96–112)
Creatinine, Ser: 1.31 mg/dL (ref 0.50–1.35)
GFR calc Af Amer: 76 mL/min — ABNORMAL LOW (ref >90)
GFR, EST NON AFRICAN AMERICAN: 66 mL/min — AB (ref >90)
Glucose, Bld: 124 mg/dL — ABNORMAL HIGH (ref 70–99)
Potassium: 4 mEq/L (ref 3.7–5.3)
Sodium: 143 mEq/L (ref 137–147)
Total Bilirubin: 0.5 mg/dL (ref 0.3–1.2)
Total Protein: 8.1 g/dL (ref 6.0–8.3)

## 2013-09-20 LAB — PROTIME-INR
INR: 1.03 (ref 0.00–1.49)
Prothrombin Time: 13.3 seconds (ref 11.6–15.2)

## 2013-09-20 LAB — LIPASE, BLOOD: LIPASE: 30 U/L (ref 11–59)

## 2013-09-20 MED ORDER — SODIUM CHLORIDE 0.9 % IV BOLUS (SEPSIS)
1000.0000 mL | Freq: Once | INTRAVENOUS | Status: AC
  Administered 2013-09-20: 1000 mL via INTRAVENOUS

## 2013-09-20 MED ORDER — SODIUM CHLORIDE 0.9 % IV BOLUS (SEPSIS)
500.0000 mL | Freq: Once | INTRAVENOUS | Status: AC
  Administered 2013-09-20: 1000 mL via INTRAVENOUS

## 2013-09-20 MED ORDER — ONDANSETRON 4 MG PO TBDP: 4.0000 mg | ORAL_TABLET | Freq: Three times a day (TID) | ORAL | Status: AC | PRN

## 2013-09-20 MED ORDER — SODIUM CHLORIDE 0.9 % IV SOLN: Freq: Once | INTRAVENOUS | Status: DC

## 2013-09-20 MED ORDER — ONDANSETRON HCL 4 MG/2ML IJ SOLN: 4.0000 mg | Freq: Once | INTRAMUSCULAR | Status: DC

## 2013-09-20 MED ORDER — MORPHINE SULFATE 4 MG/ML IJ SOLN
4.0000 mg | INTRAMUSCULAR | Status: DC | PRN
  Administered 2013-09-20: 4 mg via INTRAVENOUS
  Filled 2013-09-20: qty 1

## 2013-09-20 MED ORDER — HYDROCODONE-ACETAMINOPHEN 5-325 MG PO TABS: 2.0000 | ORAL_TABLET | ORAL | Status: AC | PRN

## 2013-09-20 MED ORDER — OMEPRAZOLE 20 MG PO CPDR: 20.0000 mg | DELAYED_RELEASE_CAPSULE | Freq: Two times a day (BID) | ORAL | Status: AC

## 2013-09-20 MED ORDER — PROMETHAZINE HCL 25 MG/ML IJ SOLN
12.5000 mg | Freq: Once | INTRAMUSCULAR | Status: AC
  Administered 2013-09-20: 12.5 mg via INTRAVENOUS
  Filled 2013-09-20: qty 1

## 2013-09-20 MED ORDER — FAMOTIDINE IN NACL 20-0.9 MG/50ML-% IV SOLN
20.0000 mg | Freq: Once | INTRAVENOUS | Status: AC
  Administered 2013-09-20: 20 mg via INTRAVENOUS
  Filled 2013-09-20: qty 50

## 2013-09-20 NOTE — ED Notes (Signed)
Per EMS, pt has been having abdominal pain and vomiting blood x 2 days, hx of same. [facility]. Has hx of ulcers and diverticulitis. 18 g IV was placed in L forearm and 4 of Zofran [facility] 13:15. One episode of vomiting in route. Vital pre-arrival BP 167/102, HR 84, RR 20, 100% on RA.

## 2013-09-20 NOTE — Discharge Instructions (Signed)
Abdominal Pain, Adult °Many things can cause abdominal pain. Usually, abdominal pain is not caused by a disease and will improve without treatment. It can often be observed and treated at home. Your health care provider will do a physical exam and possibly order blood tests and X-rays to help determine the seriousness of your pain. However, in many cases, more time must pass before a clear cause of the pain can be found. Before that point, your health care provider may not know if you need more testing or further treatment. °HOME CARE INSTRUCTIONS  °Monitor your abdominal pain for any changes. The following actions may help to alleviate any discomfort you are experiencing: °· Only take over-the-counter or prescription medicines as directed by your health care provider. °· Do not take laxatives unless directed to do so by your health care provider. °· Try a clear liquid diet (broth, tea, or water) as directed by your health care provider. Slowly move to a bland diet as tolerated. °SEEK MEDICAL CARE IF: °· You have unexplained abdominal pain. °· You have abdominal pain associated with nausea or diarrhea. °· You have pain when you urinate or have a bowel movement. °· You experience abdominal pain that wakes you in the night. °· You have abdominal pain that is worsened or improved by eating food. °· You have abdominal pain that is worsened with eating fatty foods. °SEEK IMMEDIATE MEDICAL CARE IF:  °· Your pain does not go away within 2 hours. °· You have a fever. °· You keep throwing up (vomiting). °· Your pain is felt only in portions of the abdomen, such as the right side or the left lower portion of the abdomen. °· You pass bloody or black tarry stools. °MAKE SURE YOU: °· Understand these instructions.   °· Will watch your condition.   °· Will get help right away if you are not doing well or get worse.   °Document Released: 02/04/2005 Document Revised: 02/15/2013 Document Reviewed: 01/04/2013 °ExitCare® Patient  Information ©2014 [employer]. ° °Gastritis, Adult °Gastritis is soreness and puffiness (inflammation) of the lining of the stomach. If you do not get help, gastritis can cause bleeding and sores (ulcers) in the stomach. °HOME CARE  °· Only take medicine as told by your doctor. °· If you were given antibiotic medicines, take them as told. Finish the medicines even if you start to feel better. °· Drink enough fluids to keep your pee (urine) clear or pale yellow. °· Avoid foods and drinks that make your problems worse. Foods you may want to avoid include: °· Caffeine or alcohol. °· Chocolate. °· Mint. °· Garlic and onions. °· Spicy foods. °· Citrus fruits, including oranges, lemons, or limes. °· Food containing tomatoes, including sauce, chili, salsa, and pizza. °· Fried and fatty foods. °· Eat small meals throughout the day instead of large meals. °GET HELP RIGHT AWAY IF:  °· You have black or dark red poop (stools). °· You throw up (vomit) blood. It may look like coffee grounds. °· You cannot keep fluids down. °· Your belly (abdominal) pain gets worse. °· You have a fever. °· You do not feel better after 1 week. °· You have any other questions or concerns. °MAKE SURE YOU:  °· Understand these instructions. °· Will watch your condition. °· Will get help right away if you are not doing well or get worse. °Document Released: 10/14/2007 Document Revised: 07/20/2011 Document Reviewed: 06/10/2011 °ExitCare® Patient Information ©2014 [employer]. ° °

## 2013-09-20 NOTE — ED Provider Notes (Signed)
CSN: [id number]     Arrival date & time 09/20/13  1323 History   First MD Initiated Contact with Patient 09/20/13 1345     Chief Complaint  Patient presents with  . Hematemesis  . Abdominal Pain     HPI  Patient presents with 3 days of abdominal pain with nausea and vomiting. In your visit a few weeks ago for a similar plain. Did not fill his prescriptions. Continues to drink heavily on weekends. Smokes every day. Stasis ulcers  "for years". Episode of emesis yesterday continue "streaks of blood" no dark stools. States they're "green or yellow"  Past Medical History  Diagnosis Date  . Alcohol abuse    History reviewed. No pertinent past surgical history. History reviewed. No pertinent family history. History  Substance Use Topics  . Smoking status: Current Every Day Smoker -- 0.50 packs/day    Types: Cigarettes  . Smokeless tobacco: Never Used  . Alcohol Use: Yes    Review of Systems  Constitutional: Negative for fever, chills, diaphoresis, appetite change and fatigue.  HENT: Negative for mouth sores, sore throat and trouble swallowing.   Eyes: Negative for visual disturbance.  Respiratory: Negative for cough, chest tightness, shortness of breath and wheezing.   Cardiovascular: Negative for chest pain.  Gastrointestinal: Positive for nausea, vomiting and abdominal pain. Negative for diarrhea and abdominal distention.  Endocrine: Negative for polydipsia, polyphagia and polyuria.  Genitourinary: Negative for dysuria, frequency and hematuria.  Musculoskeletal: Negative for gait problem.  Skin: Negative for color change, pallor and rash.  Neurological: Negative for dizziness, syncope, light-headedness and headaches.  Hematological: Does not bruise/bleed easily.  Psychiatric/Behavioral: Negative for behavioral problems and confusion.      Allergies  Review of patient's allergies indicates no known allergies.  Home Medications   Prior to Admission medications   Not on  File   BP 159/98  Pulse 81  Temp(Src) 99 F (37.2 C) (Oral)  Resp 20  SpO2 94% Physical Exam  Constitutional: He is oriented to person, place, and time. He appears well-developed and well-nourished. No distress.  HENT:  Head: Normocephalic.  Conjunctiva are not pale. No scleral icterus  Eyes: Conjunctivae are normal. Pupils are equal, round, and reactive to light. No scleral icterus.  Neck: Normal range of motion. Neck supple. No thyromegaly present.  Cardiovascular: Normal rate and regular rhythm.  Exam reveals no gallop and no friction rub.   No murmur heard. Pulmonary/Chest: Effort normal and breath sounds normal. No respiratory distress. He has no wheezes. He has no rales.  Abdominal: Soft. Bowel sounds are normal. He exhibits no distension. There is no tenderness. There is no rebound.  Abdomen is soft. No guarding rebound or peritoneal irritation.  Musculoskeletal: Normal range of motion.  Neurological: He is alert and oriented to person, place, and time.  Skin: Skin is warm and dry. No rash noted.  Psychiatric: He has a normal mood and affect. His behavior is normal.    ED Course  Procedures (including critical care time) Labs Review Labs Reviewed  COMPREHENSIVE METABOLIC PANEL - Abnormal; Notable for the following:    Glucose, Bld 124 (*)    Alkaline Phosphatase 149 (*)    GFR calc non Af Amer 66 (*)    GFR calc Af Amer 76 (*)    All other components within normal limits  CBC WITH DIFFERENTIAL  PROTIME-INR  LIPASE, BLOOD    Imaging Review No results found.   EKG Interpretation None      MDM  Final diagnoses:  Abdominal pain  Gastritis    Labs are reassuring. No additional emesis here. His episodes of emesis they did not have blood. One episode of hematemesis yesterday.  S2 no uncertain terms to stop smoking. Stop drinking. 2 please actually fill a prescription for, and take  his acid blocking medication.    Rolland PorterMark Knox Holdman, MD 09/20/13 [phone]

## 2016-03-01 IMAGING — CT CT ABD-PELV W/ CM
1 of 3 series · 14 of 32 positions shown, 19 images · IV contrast (OMNIPAQUE 300)
Comparison: none

[Series 2: abd/pel with · axial · 0.77mm/px · z∈[-238,+197]mm · 14 of 99 slices shown, 19 images]
[im 6/99  soft-tissue]
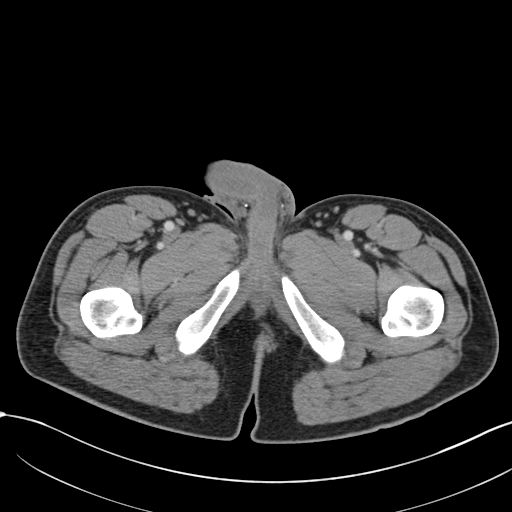
[im 6/99  bone]
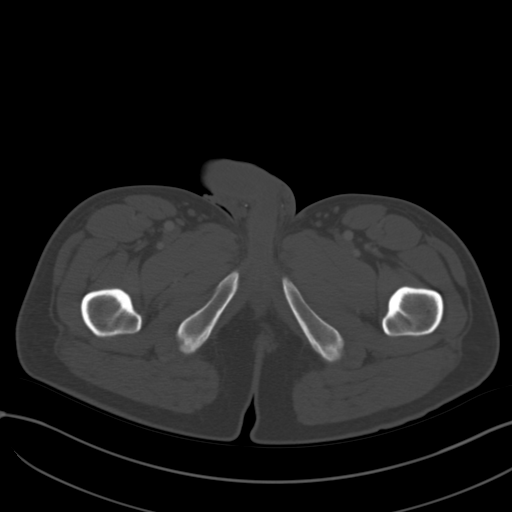
[im 16/99  soft-tissue]
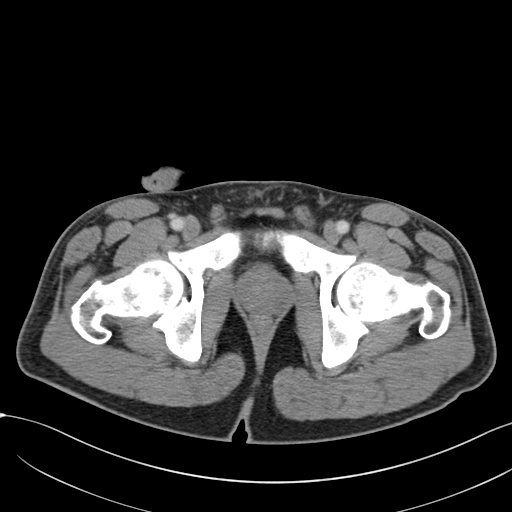
[im 21/99  soft-tissue]
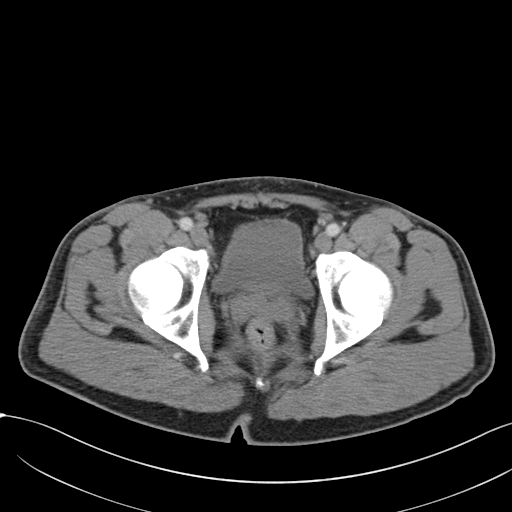
[im 26/99  soft-tissue]
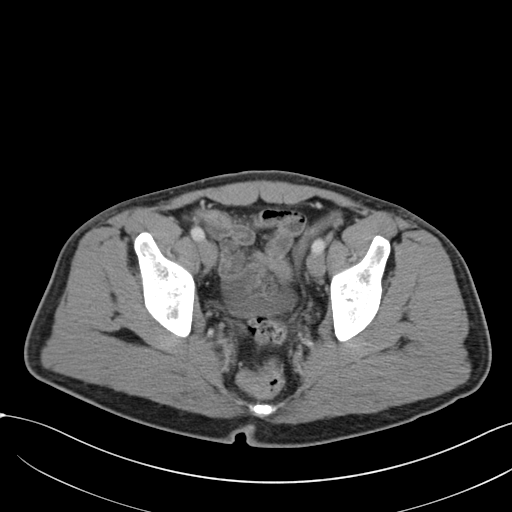
[im 37/99  soft-tissue]
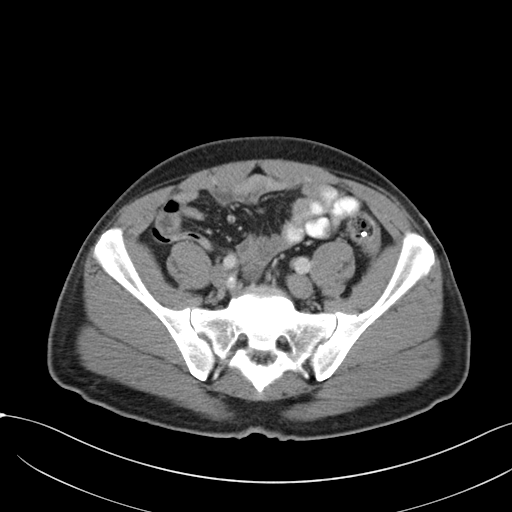
[im 42/99  soft-tissue]
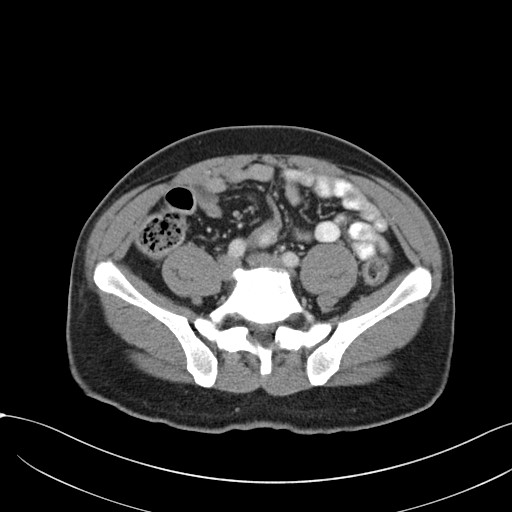
[im 52/99  soft-tissue]
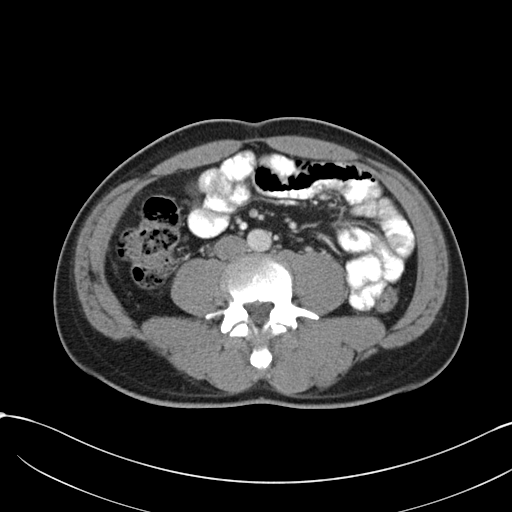
[im 57/99  soft-tissue]
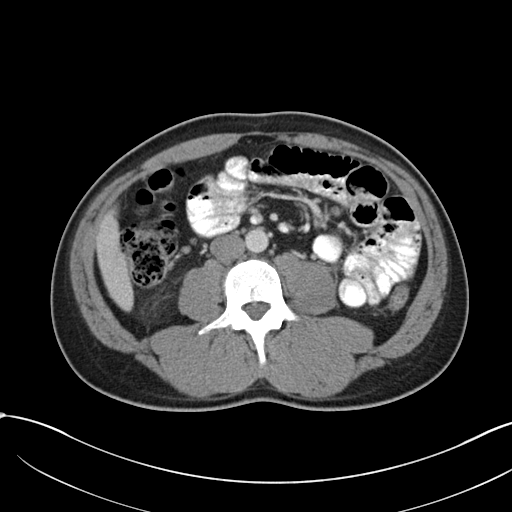
[im 62/99  soft-tissue]
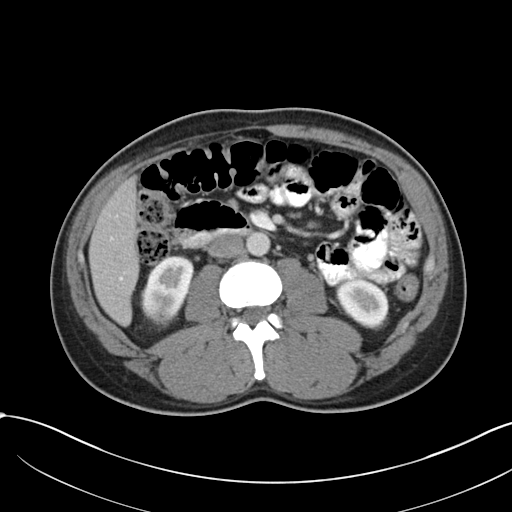
[im 62/99  bone]
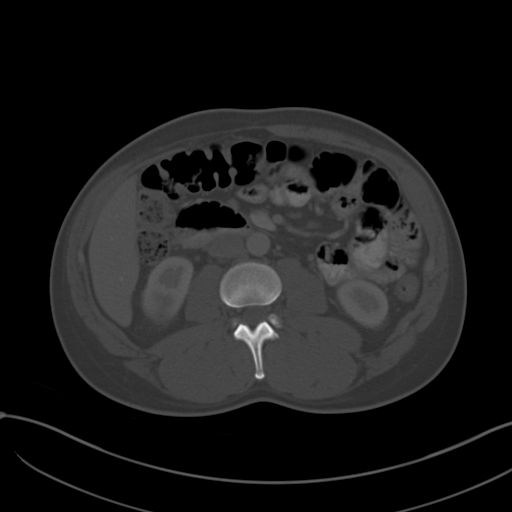
[im 73/99  soft-tissue]
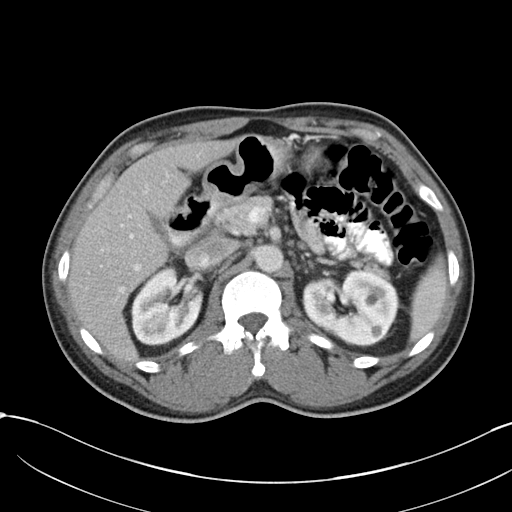
[im 78/99  soft-tissue]
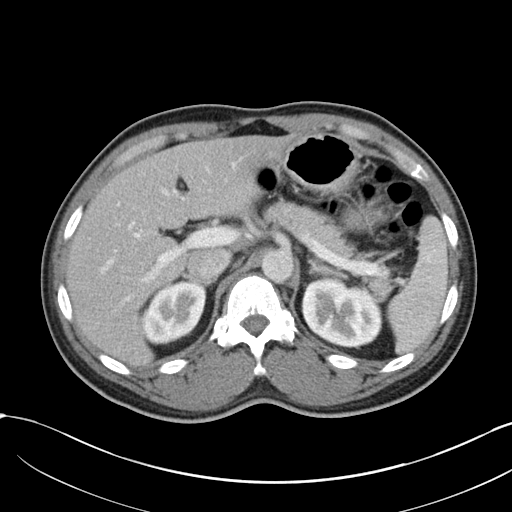
[im 78/99  lung]
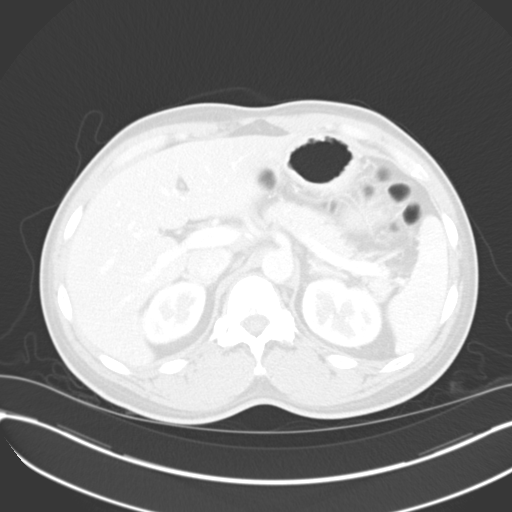
[im 83/99  soft-tissue]
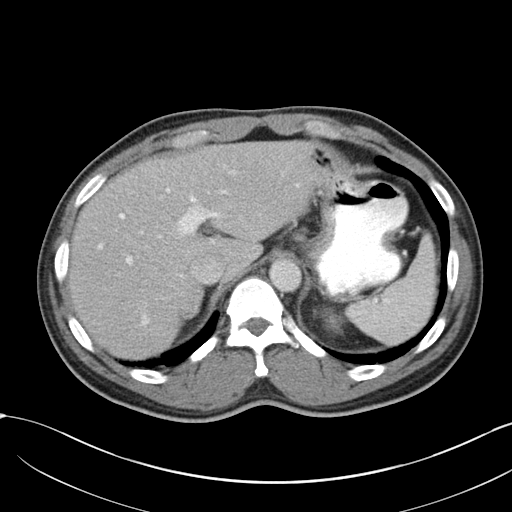
[im 83/99  lung]
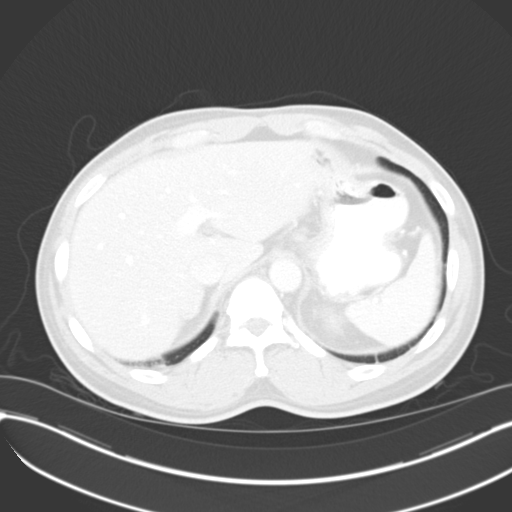
[im 88/99  lung]
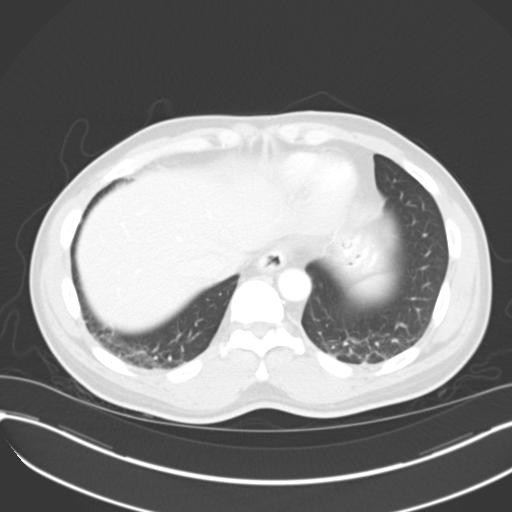
[im 93/99  soft-tissue]
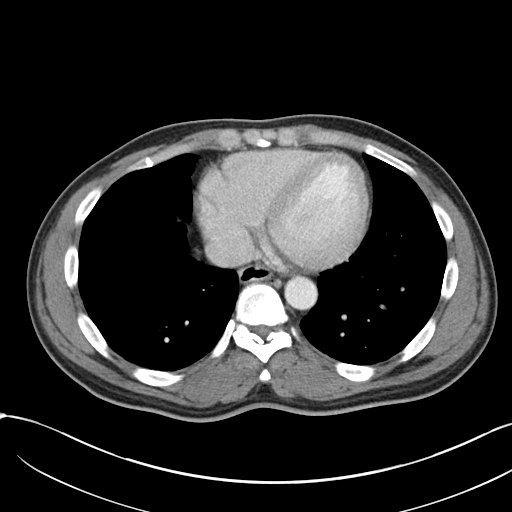
[im 93/99  lung]
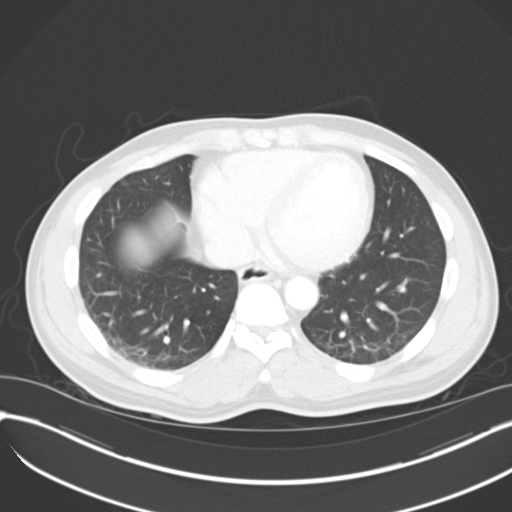

[14 of 32 positions shown; findings below may reference images not displayed]

CLINICAL DATA
Progressive abdomen pain

EXAM
CT ABDOMEN AND PELVIS WITH CONTRAST

TECHNIQUE
Multidetector CT imaging of the abdomen and pelvis was performed
using the standard protocol following bolus administration of
intravenous contrast.

CONTRAST
50mL OMNIPAQUE IOHEXOL 300 MG/ML SOLN, 100mL OMNIPAQUE IOHEXOL 300
MG/ML SOLN

COMPARISON
None.

FINDINGS
The liver, spleen, pancreas, gallbladder, adrenal glands are normal.
There are multiple simple cysts in bilateral kidneys, largest in the
lower pole measuring 3.3 x 3.8 cm. The aorta is normal. There is no
abdominal lymphadenopathy. There is no small bowel obstruction or
diverticulitis. The appendix is normal.

Partial fluid-filled bladder is normal. Than probably any
diverticulum extending off the superior aspect of right side
bladder. There is mild dependent atelectasis of the posterior lung
bases. There is scoliosis of spine. Ribs mild degenerative joint
changes of spine are noted.

IMPRESSION
No acute abnormality identified in the abdomen and pelvis.

SIGNATURE
